# Patient Record
Sex: Male | Born: 1970
Health system: Southern US, Community
[De-identification: ages and names within clinical notes are randomized; demographics above are authoritative.]

## PROBLEM LIST (undated history)

## (undated) DIAGNOSIS — T7840XA Allergy, unspecified, initial encounter: Secondary | ICD-10-CM

## (undated) DIAGNOSIS — K219 Gastro-esophageal reflux disease without esophagitis: Secondary | ICD-10-CM

## (undated) DIAGNOSIS — I10 Essential (primary) hypertension: Secondary | ICD-10-CM

## (undated) DIAGNOSIS — M5412 Radiculopathy, cervical region: Secondary | ICD-10-CM

## (undated) HISTORY — DX: Allergy, unspecified, initial encounter: T78.40XA

## (undated) HISTORY — DX: Radiculopathy, cervical region: M54.12

## (undated) HISTORY — DX: Gastro-esophageal reflux disease without esophagitis: K21.9

## (undated) HISTORY — DX: Essential (primary) hypertension: I10

---

## 2004-06-15 ENCOUNTER — Ambulatory Visit: Payer: Self-pay | Admitting: Family Medicine

## 2005-04-17 ENCOUNTER — Ambulatory Visit: Payer: Self-pay | Admitting: Family Medicine

## 2005-06-28 ENCOUNTER — Ambulatory Visit: Payer: Self-pay | Admitting: Family Medicine

## 2005-07-28 ENCOUNTER — Encounter (INDEPENDENT_AMBULATORY_CARE_PROVIDER_SITE_OTHER): Payer: Self-pay | Admitting: Specialist

## 2005-07-28 ENCOUNTER — Ambulatory Visit: Payer: Self-pay | Admitting: Family Medicine

## 2005-07-28 HISTORY — PX: VASECTOMY: SHX75

## 2005-08-01 ENCOUNTER — Ambulatory Visit: Payer: Self-pay | Admitting: Family Medicine

## 2008-05-26 ENCOUNTER — Ambulatory Visit: Payer: Self-pay | Admitting: Family Medicine

## 2008-08-04 ENCOUNTER — Ambulatory Visit: Payer: Self-pay | Admitting: Family Medicine

## 2008-08-04 DIAGNOSIS — B360 Pityriasis versicolor: Secondary | ICD-10-CM | POA: Insufficient documentation

## 2008-09-30 ENCOUNTER — Telehealth: Payer: Self-pay | Admitting: Family Medicine

## 2008-12-24 ENCOUNTER — Ambulatory Visit: Payer: Self-pay | Admitting: Family Medicine

## 2008-12-24 LAB — CONVERTED CEMR LAB
ALT: 25 units/L (ref 0–53)
Bilirubin, Direct: 0.2 mg/dL (ref 0.0–0.3)
Chloride: 106 meq/L (ref 96–112)
Cholesterol: 177 mg/dL (ref 0–200)
Creatinine, Ser: 1 mg/dL (ref 0.4–1.5)
GFR calc non Af Amer: 88.25 mL/min (ref >60)
HDL: 59.2 mg/dL (ref >39.00)
LDL Cholesterol: 102 mg/dL — ABNORMAL HIGH (ref 0–99)
Potassium: 4 meq/L (ref 3.5–5.1)
Total Bilirubin: 1.1 mg/dL (ref 0.3–1.2)
VLDL: 15.6 mg/dL (ref 0.0–40.0)

## 2010-01-05 ENCOUNTER — Ambulatory Visit: Payer: Self-pay | Admitting: Family Medicine

## 2010-01-05 LAB — CONVERTED CEMR LAB
ALT: 30 units/L (ref 0–53)
Albumin: 4.3 g/dL (ref 3.5–5.2)
Basophils Relative: 0.4 % (ref 0.0–3.0)
CO2: 31 meq/L (ref 19–32)
Chloride: 107 meq/L (ref 96–112)
Creatinine, Ser: 1 mg/dL (ref 0.4–1.5)
Eosinophils Absolute: 0.1 10*3/uL (ref 0.0–0.7)
Eosinophils Relative: 2.1 % (ref 0.0–5.0)
HCT: 41.1 % (ref 39.0–52.0)
Hemoglobin: 14.1 g/dL (ref 13.0–17.0)
Lymphs Abs: 1.8 10*3/uL (ref 0.7–4.0)
MCHC: 34.4 g/dL (ref 30.0–36.0)
MCV: 86.9 fL (ref 78.0–100.0)
Monocytes Absolute: 0.8 10*3/uL (ref 0.1–1.0)
Neutro Abs: 2.8 10*3/uL (ref 1.4–7.7)
Potassium: 4.5 meq/L (ref 3.5–5.1)
RBC: 4.73 M/uL (ref 4.22–5.81)
Sodium: 142 meq/L (ref 135–145)
Total CHOL/HDL Ratio: 3
Total Protein: 7.1 g/dL (ref 6.0–8.3)
Triglycerides: 92 mg/dL (ref 0.0–149.0)
WBC: 5.5 10*3/uL (ref 4.5–10.5)

## 2010-01-06 ENCOUNTER — Ambulatory Visit: Payer: Self-pay | Admitting: Family Medicine

## 2010-08-30 NOTE — Assessment & Plan Note (Signed)
Summary: CPX/FILL OUT FORM FOR [employer]/CE   Vital Signs:  Patient profile:   40 year old male Height:      73 inches Weight:      224.75 pounds BMI:     29.76 Temp:     98.2 degrees F oral Pulse rate:   80 / minute Pulse rhythm:   regular BP sitting:   120 / 82  (left arm) Cuff size:   large  Vitals Entered By: Sydell Axon LPN ([birth year] 06, 1914 8:06 AM) CC: 30 Minute checkup, fill out form for [employer]   History of Present Illness: Pt here for Comp Exam for [employer]. He feels well and has no complaints. He has had itchy nose and eyes.   Preventive Screening-Counseling & Management  Alcohol-Tobacco     Alcohol drinks/day: <1     Alcohol type: beer     Smoking Status: never  Caffeine-Diet-Exercise     Caffeine use/day: 2     Does Patient Exercise: yes     Type of exercise: dog and chldren     Exercise (avg: min/session): daily  Problems Prior to Update: 1)  Health Maintenance Exam  (ICD-V70.0) 2)  Tinea Versicolor  (ICD-111.0)  Medications Prior to Update: 1)  None  Allergies: No Known Drug Allergies  Past History:  Family History: Last updated: 01/06/2010 Father A 25 Mother A 66  DM Siblings: none  Social History: Last updated: 05/26/2008 Marital Status: Married Children: 2 Occupation: IT at [employer]  Risk Factors: Alcohol Use: <1 (01/06/2010) Caffeine Use: 2 (01/06/2010) Exercise: yes (01/06/2010)  Risk Factors: Smoking Status: never (01/06/2010)  Past Surgical History: Vasectomy  [place]) (07/28/2005)  Family History: Father A 45 Mother A 66  DM Siblings: none  Social History: Caffeine use/day:  2 Does Patient Exercise:  yes  Review of Systems General:  Denies chills, fatigue, fever, sweats, weakness, and weight loss. Eyes:  Denies blurring, discharge, eye pain, and itching. ENT:  Denies decreased hearing, earache, and ringing in ears. CV:  Denies chest pain or discomfort, fainting, fatigue, palpitations, shortness  of breath with exertion, swelling of feet, and swelling of hands. Resp:  Denies chest pain with inspiration, cough, shortness of breath, and wheezing. GI:  Denies abdominal pain, bloody stools, change in bowel habits, constipation, dark tarry stools, diarrhea, indigestion, loss of appetite, nausea, vomiting, vomiting blood, and yellowish skin color. GU:  Denies discharge, dysuria, nocturia, and urinary frequency. MS:  Denies joint pain, low back pain, muscle aches, cramps, and stiffness. Derm:  Denies dryness, itching, and rash. Neuro:  Denies numbness, poor balance, tingling, and tremors.  Physical Exam  General:  Well-developed,well-nourished,in no acute distress; alert,appropriate and cooperative throughout examination Head:  Normocephalic and atraumatic without obvious abnormalities. No apparent alopecia or balding. Eyes:  Conjunctiva clear bilaterally.  Ears:  External ear exam shows no significant lesions or deformities.  Otoscopic examination reveals clear canals, tympanic membranes are intact bilaterally without bulging, retraction, inflammation or discharge. Hearing is grossly normal bilaterally. Nose:  External nasal examination shows no deformity or inflammation. Nasal mucosa are pink and moist without lesions or exudates. Mouth:  Oral mucosa and oropharynx without lesions or exudates.  Teeth in good repair. Neck:  No deformities, masses, or tenderness noted. Chest Wall:  No deformities, masses, tenderness or gynecomastia noted. Breasts:  No masses or gynecomastia noted Lungs:  Normal respiratory effort, chest expands symmetrically. Lungs are clear to auscultation, no crackles or wheezes. Heart:  Normal rate and  regular rhythm. S1 and S2 normal without gallop, murmur, click, rub or other extra sounds. Abdomen:  Bowel sounds positive,abdomen soft and non-tender without masses, organomegaly or hernias noted. Rectal:  No external abnormalities noted. Normal sphincter tone. No rectal  masses or tenderness. G neg. Genitalia:  Testes bilaterally descended without nodularity, tenderness or masses. No scrotal masses or lesions. No penis lesions or urethral discharge. Prostate:  Prostate gland firm and smooth, no enlargement, nodularity, tenderness, mass, asymmetry or induration. 10 gms. Msk:  No deformity or scoliosis noted of thoracic or lumbar spine.   Pulses:  R and L carotid,radial,femoral,dorsalis pedis and posterior tibial pulses are full and equal bilaterally Extremities:  No clubbing, cyanosis, edema, or deformity noted with normal full range of motion of all joints.   Neurologic:  No cranial nerve deficits noted. Station and gait are normal. Plantar reflexes are down-going bilaterally. DTRs are symmetrical throughout. Sensory, motor and coordinative functions appear intact. Skin:  Upper chest to nipple area and into axillae sparsely bilat maculopapular somewhat coalescent but individual oval erythem compared to regular skin patches  last visit.Marland KitchenMarland KitchenMarland KitchenAll  resolved today. Skin looks nml Cervical Nodes:  No lymphadenopathy noted Inguinal Nodes:  No significant adenopathy Psych:  Cognition and judgment appear intact. Alert and cooperative with normal attention span and concentration. No apparent delusions, illusions, hallucinations   Impression & Recommendations:  Problem # 1:  HEALTH MAINTENANCE EXAM (ICD-V70.0) Assessment Comment Only  Reviewed preventive care protocols, scheduled due services, and updated immunizations.  Problem # 2:  TINEA VERSICOLOR (ICD-111.0) Assessment: Improved  Has resolved.  Take medication as directed for full duration.   Patient Instructions: 1)  Form for [employer] filled out.  Current Allergies (reviewed today): No known allergies

## 2010-08-30 NOTE — Letter (Signed)
Summary: Physical Examination Form  Physical Examination Form   Imported By: Beau Fanny 01/07/2010 08:21:11  _____________________________________________________________________  External Attachment:    Type:   Image     Comment:   External Document

## 2012-04-08 ENCOUNTER — Encounter: Payer: Self-pay | Admitting: Family Medicine

## 2012-04-08 ENCOUNTER — Ambulatory Visit (INDEPENDENT_AMBULATORY_CARE_PROVIDER_SITE_OTHER): Payer: BC Managed Care – PPO | Admitting: Family Medicine

## 2012-04-08 VITALS — BP 132/92 | HR 64 | Temp 98.5°F | Wt 232.2 lb

## 2012-04-08 DIAGNOSIS — M25522 Pain in left elbow: Secondary | ICD-10-CM | POA: Insufficient documentation

## 2012-04-08 DIAGNOSIS — M79602 Pain in left arm: Secondary | ICD-10-CM

## 2012-04-08 DIAGNOSIS — M79609 Pain in unspecified limb: Secondary | ICD-10-CM

## 2012-04-08 MED ORDER — NAPROXEN 500 MG PO TABS: ORAL_TABLET | ORAL | Status: AC

## 2012-04-08 NOTE — Assessment & Plan Note (Signed)
anticipate lateral epicondylitis vs forearm extensor tendonitis.  Treat as such with NSAIDs, and stretching exercises from [facility] pt advisor. Discussed elbow strap as well. Update [place] if sxs continued despite treatment.

## 2012-04-08 NOTE — Progress Notes (Signed)
  Subjective:    Patient ID: Roger Evans, male    DOB: 1971/02/26, 41 y.o.   MRN: [id number]  HPI CC: L arm pain  Pleasant 41 yo prior pt of Dr. Lorenza Chick presents with 3.5 wk h/o L arm pain starting at elbow, extending down to wrist.  Pain described as dull ache.  Intermittent.  Sometimes notes trouble lifting iPad and laptop with left hand 2/2 pain.  Noted pain posterior elbow, occasionally down to wrist.  Occasional tingling noted L dorsal hand.  No inciting trauma/injury. 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  Went camping in June, had a few tick bites then.  No fevers/chills, neck pain, shooting pain down arms, denies numbness/weakness of arm.  Took advil last night.  Review of Systems Per HPI    Objective:   Physical Exam  Nursing note and vitals reviewed. Constitutional: He appears well-developed and well-nourished. No distress.  Musculoskeletal:       Right elbow: Normal.      Left elbow: He exhibits normal range of motion and no swelling. tenderness found. Lateral epicondyle tenderness noted. No medial epicondyle and no olecranon process tenderness noted.       No erythema, warmth or swelling present. Tender with strength testing of forced supination against resistance and forced extension against resistance at left elbow.       Assessment & Plan:

## 2012-04-08 NOTE — Patient Instructions (Signed)
You have tennis elbow (lateral epicondylitis) or tendonitis of your forearm extensor muscles. take naprosyn twice daily with food for 5 days then as needed for pain/inflammation. Do stretching exercises provided. May buy over the counter elbow strap or band. Let me know if not improving as expected.

## 2012-07-31 DIAGNOSIS — M5412 Radiculopathy, cervical region: Secondary | ICD-10-CM

## 2012-07-31 HISTORY — DX: Radiculopathy, cervical region: M54.12

## 2013-05-29 ENCOUNTER — Ambulatory Visit (INDEPENDENT_AMBULATORY_CARE_PROVIDER_SITE_OTHER): Payer: BC Managed Care – PPO | Admitting: Family Medicine

## 2013-05-29 ENCOUNTER — Encounter: Payer: Self-pay | Admitting: Family Medicine

## 2013-05-29 VITALS — BP 124/76 | HR 92 | Temp 97.8°F | Wt 235.8 lb

## 2013-05-29 DIAGNOSIS — M79609 Pain in unspecified limb: Secondary | ICD-10-CM

## 2013-05-29 DIAGNOSIS — M25519 Pain in unspecified shoulder: Secondary | ICD-10-CM

## 2013-05-29 DIAGNOSIS — M25512 Pain in left shoulder: Secondary | ICD-10-CM

## 2013-05-29 DIAGNOSIS — M79602 Pain in left arm: Secondary | ICD-10-CM

## 2013-05-29 MED ORDER — PREDNISONE 20 MG PO TABS: ORAL_TABLET | ORAL | Status: DC

## 2013-05-29 MED ORDER — METHOCARBAMOL 500 MG PO TABS: 500.0000 mg | ORAL_TABLET | Freq: Four times a day (QID) | ORAL | Status: DC | PRN

## 2013-05-29 NOTE — Assessment & Plan Note (Signed)
I think this is musculoskeletal/myofascial dysfunction - either supraspinatus tendonitis given location of pain or scapular dyskinesis. Will treat with steroid course, and muscle relaxants.   Will refer to physical therapy as well. If persistent, consider further evaluation by ortho.  Check EKG today - sinus arrhythmia in 80s with normal axis, intervals, no hypertrophy or acute ST/T changes.  ?P mitrale

## 2013-05-29 NOTE — Addendum Note (Signed)
Addended by: Eustaquio Boyden on: 05/29/2013 07:27 PM   Modules accepted: Level of Service

## 2013-05-29 NOTE — Patient Instructions (Signed)
I think you have inflammation of muscles of neck on left and possible supraspinatus tendon inflammation. Treat with steroid course and muscle relaxants (both sent to pharmacy). Update [place] if symptoms persist or worsen for further evaluation. Continue ice/heat (whichever soothes better).

## 2013-05-29 NOTE — Progress Notes (Signed)
  Subjective:    Patient ID: Roger Evans, male    DOB: 04-29-1971, 42 y.o.   MRN: [id number]  HPI CC: L shoulder/neck pain  2 wks ago when awoke started noticing L shoulder/neck pain.  Heating pad did help.  Over last 2 days noticing worsening pain.  Describes chest wall pain anteriorly, to shoulder blade and down arm.  Having some tingling of left arm/hand as well.  Hand felt tight.  Worse with movement, positional (bending forward worsens pain).  Constant ache. Neck movement worsens pain. Denies pressure/tightness in chest. No inciting trauma/falls, injury.  No fevers/chills.  No midline neck pain.  So far tried ibuprofen and naprosyn (which helps).  Took total of 6 OTC naprosyn today.  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  No worsening of pain with walking and hiking.  Tries to run on treadmill, has not done this in last 2 weeks.  Stays active at work on [employer], no pain with this.  Does feel some discomfort when walking up stairs.  No fam hx CAD.  No recnet blood work.  Nonsmoker. History reviewed. No pertinent past medical history.  Family History  Problem Relation Age of Onset  . Diabetes Mother     Review of Systems Pre HPI    Objective:   Physical Exam  Nursing note and vitals reviewed. Constitutional: He appears well-developed and well-nourished. No distress.  Cardiovascular: Normal rate, regular rhythm, normal heart sounds and intact distal pulses.   No murmur heard. Pulmonary/Chest: Effort normal and breath sounds normal. No respiratory distress. He has no wheezes. He has no rales.  Musculoskeletal: He exhibits no edema.  FROM of neck, reproducible pain with lateral rotation of neck to right No deformity noted on shoulder exam Tender to palpation L back superior to spine of scapula. FROM at shoulders No pain with int/ext shoulder rotation against resistance. Tender with strength testing of L supraspinatus with empty can test. No  impingement, neg crossover test No pain with rotation of humeral head in GH joint. No chest wall pain to palpation.   Neurological:  Strength intact Sensation intact Neg spurling       Assessment & Plan:

## 2013-06-03 ENCOUNTER — Telehealth: Payer: Self-pay

## 2013-06-03 DIAGNOSIS — M25512 Pain in left shoulder: Secondary | ICD-10-CM

## 2013-06-03 MED ORDER — NAPROXEN 500 MG PO TABS: ORAL_TABLET | ORAL | Status: DC

## 2013-06-03 NOTE — Telephone Encounter (Signed)
Pt was seen 05/29/13 and lt arm pain coming and going but worse at night so pt is not sleeping. Pt said pain has localized as dull achy pain in l t shoulder and sharp pain in lt elbow with numbness in forearm at elbow. Neck pain is same as when seen and no CP.Please advise. [employer].

## 2013-06-03 NOTE — Telephone Encounter (Signed)
Spoke with patient - worsening shoulder and elbow pain despite prednisone. Recommended stop prednisone, will refer to PT, start naprosyn anti inflammatory, and double up on muscle relaxant. Call me tomorrow with update on how night went. Could consider narcotic prn breakthrough pain vs referral to SM.

## 2013-06-03 NOTE — Telephone Encounter (Signed)
Pt left v/m requesting cb. 

## 2013-06-04 NOTE — Telephone Encounter (Signed)
The patient came in and is hoping to pick up an rx for Vicodin to help with shoulder pain. His callback number is [phone]

## 2013-06-04 NOTE — Telephone Encounter (Signed)
Spoke with patient - he actually would rather not add on narcotic. Will continue naprosyn and robaxin, has started exercises which seemed to have helped today. Will continue course, advised if pain worsening, I recommend re evaluation in office. Pt agrees.

## 2013-06-05 ENCOUNTER — Telehealth: Payer: Self-pay

## 2013-06-05 NOTE — Telephone Encounter (Signed)
I would try cutting back on the methocarbamol and see if that helps.  It could be causing some of that.  Thanks.

## 2013-06-05 NOTE — Telephone Encounter (Signed)
Pt only taking Naprosyn and Methocarbamol; pt said he feels moody or sad, no SI or HI. Pt has loss of appetite and sometimes does not feel he can get a deep breath. Pt said he is not having SOB or difficulty breathing that just occasional cannot get a good deep breath.pt wants to know if thinks one of the meds could cause these symptoms.Please advise. [employer].

## 2013-06-05 NOTE — Telephone Encounter (Signed)
Pt.notified

## 2013-06-16 ENCOUNTER — Encounter: Payer: Self-pay | Admitting: Internal Medicine

## 2013-06-30 ENCOUNTER — Encounter: Payer: Self-pay | Admitting: Internal Medicine

## 2013-07-01 ENCOUNTER — Encounter (INDEPENDENT_AMBULATORY_CARE_PROVIDER_SITE_OTHER): Payer: BC Managed Care – PPO | Admitting: Ophthalmology

## 2013-07-01 DIAGNOSIS — H43819 Vitreous degeneration, unspecified eye: Secondary | ICD-10-CM

## 2013-07-01 DIAGNOSIS — H3540 Unspecified peripheral retinal degeneration: Secondary | ICD-10-CM

## 2013-07-31 ENCOUNTER — Encounter: Payer: Self-pay | Admitting: Internal Medicine

## 2015-08-26 ENCOUNTER — Encounter: Payer: Self-pay | Admitting: Family Medicine

## 2015-08-26 ENCOUNTER — Ambulatory Visit (INDEPENDENT_AMBULATORY_CARE_PROVIDER_SITE_OTHER): Payer: BLUE CROSS/BLUE SHIELD | Admitting: Family Medicine

## 2015-08-26 VITALS — BP 132/80 | HR 88 | Temp 98.4°F | Wt 238.2 lb

## 2015-08-26 DIAGNOSIS — H8110 Benign paroxysmal vertigo, unspecified ear: Secondary | ICD-10-CM | POA: Insufficient documentation

## 2015-08-26 DIAGNOSIS — H8111 Benign paroxysmal vertigo, right ear: Secondary | ICD-10-CM

## 2015-08-26 NOTE — Assessment & Plan Note (Signed)
Story/exam consistent with BPPV - treated in office with modified epley canalith repositioning maneuver. Update if persistent for vestibular rehab Discussed meclizine, didn't recommend given mild nature of his symptoms.

## 2015-08-26 NOTE — Patient Instructions (Addendum)
I do think you have benign positional vertigo Do exercises provided today. If persistent trouble let me know for vestibular rehab  Benign Positional Vertigo Vertigo is the feeling that you or your surroundings are moving when they are not. Benign positional vertigo is the most common form of vertigo. The cause of this condition is not serious (is benign). This condition is triggered by certain movements and positions (is positional). This condition can be dangerous if it occurs while you are doing something that could endanger you or others, such as driving.  CAUSES In many cases, the cause of this condition is not known. It may be caused by a disturbance in an area of the inner ear that helps your brain to sense movement and balance. This disturbance can be caused by a viral infection (labyrinthitis), head injury, or repetitive motion. RISK FACTORS This condition is more likely to develop in:  Women.  People who are 41 years of age or older. SYMPTOMS Symptoms of this condition usually happen when you move your head or your eyes in different directions. Symptoms may start suddenly, and they usually last for less than a minute. Symptoms may include:  Loss of balance and falling.  Feeling like you are spinning or moving.  Feeling like your surroundings are spinning or moving.  Nausea and vomiting.  Blurred vision.  Dizziness.  Involuntary eye movement (nystagmus). Symptoms can be mild and cause only slight annoyance, or they can be severe and interfere with daily life. Episodes of benign positional vertigo may return (recur) over time, and they may be triggered by certain movements. Symptoms may improve over time. DIAGNOSIS This condition is usually diagnosed by medical history and a physical exam of the head, neck, and ears. You may be referred to a health care provider who specializes in ear, nose, and throat (ENT) problems (otolaryngologist) or a provider who specializes in disorders  of the nervous system (neurologist). You may have additional testing, including:  MRI.  A CT scan.  Eye movement tests. Your health care provider may ask you to change positions quickly while he or she watches you for symptoms of benign positional vertigo, such as nystagmus. Eye movement may be tested with an electronystagmogram (ENG), caloric stimulation, the Dix-Hallpike test, or the roll test.  An electroencephalogram (EEG). This records electrical activity in your brain.  Hearing tests. TREATMENT Usually, your health care provider will treat this by moving your head in specific positions to adjust your inner ear back to normal. Surgery may be needed in severe cases, but this is rare. In some cases, benign positional vertigo may resolve on its own in 2-4 weeks. HOME CARE INSTRUCTIONS Safety  Move slowly.Avoid sudden body or head movements.  Avoid driving.  Avoid operating heavy machinery.  Avoid doing any tasks that would be dangerous to you or others if a vertigo episode would occur.  If you have trouble walking or keeping your balance, try using a cane for stability. If you feel dizzy or unstable, sit down right away.  Return to your normal activities as told by your health care provider. Ask your health care provider what activities are safe for you. General Instructions  Take over-the-counter and prescription medicines only as told by your health care provider.  Avoid certain positions or movements as told by your health care provider.  Drink enough fluid to keep your urine clear or pale yellow.  Keep all follow-up visits as told by your health care provider. This is important. [place]  CARE IF:  You have a fever.  Your condition gets worse or you develop new symptoms.  Your family or friends notice any behavioral changes.  Your nausea or vomiting gets worse.  You have numbness or a "pins and needles" sensation. SEEK IMMEDIATE MEDICAL CARE IF:  You have  difficulty speaking or moving.  You are always dizzy.  You faint.  You develop severe headaches.  You have weakness in your legs or arms.  You have changes in your hearing or vision.  You develop a stiff neck.  You develop sensitivity to light.   This information is not intended to replace advice given to you by your health care provider. Make sure you discuss any questions you have with your health care provider.   Document Released: 04/24/2006 Document Revised: 04/07/2015 Document Reviewed: 11/09/2014 Elsevier Interactive Patient Education [employer].

## 2015-08-26 NOTE — Progress Notes (Signed)
Pre visit review using our clinic review tool, if applicable. No additional management support is needed unless otherwise documented below in the visit note. 

## 2015-08-26 NOTE — Progress Notes (Signed)
   BP 132/80 mmHg  Pulse 88  Temp(Src) 98.4 F (36.9 C) (Oral)  Wt 238 lb 4 oz (108.069 kg)   CC: dizziness  Subjective:    Patient ID: Roger Evans, male    DOB: 05/19/1971, 45 y.o.   MRN: [id number]  HPI: POE Roger Evans is a 45 y.o. male presenting on 08/26/2015 for Dizziness   Last seen here 04/2013.   Woke up Monday morning with dizziness, reoccurred when lay in bed again. Only happening with position changes to supine. Dizziness described as drunk sensation that lasts <1 minute "room spinning".   No hearing changes, no tinnitus. No presyncope. No nausea. No headaches, vision changes.  No recent cold symptoms, congestion, cough. No fever.  PRN ibuprofen.   Known cervical HNP C5-7. Sees [facility] [facility].   Relevant past medical, surgical, family and social history reviewed and updated as indicated. Interim medical history since our last visit reviewed. Allergies and medications reviewed and updated. No current outpatient prescriptions on file prior to visit.   No current facility-administered medications on file prior to visit.    Review of Systems Per HPI unless specifically indicated in ROS section     Objective:    BP 132/80 mmHg  Pulse 88  Temp(Src) 98.4 F (36.9 C) (Oral)  Wt 238 lb 4 oz (108.069 kg)  Wt Readings from Last 3 Encounters:  08/26/15 238 lb 4 oz (108.069 kg)  05/29/13 235 lb 12 oz (106.935 kg)  04/08/12 232 lb 4 oz (105.348 kg)    Physical Exam  Constitutional: He is oriented to person, place, and time. He appears well-developed and well-nourished. No distress.  HENT:  Mouth/Throat: Oropharynx is clear and moist. No oropharyngeal exudate.  Eyes: Conjunctivae and EOM are normal. Pupils are equal, round, and reactive to light. No scleral icterus.  Neck: Normal range of motion. Neck supple. Carotid bruit is not present. No thyromegaly present.  Cardiovascular: Normal rate, regular rhythm, normal heart sounds and intact distal pulses.   No murmur  heard. Pulmonary/Chest: Effort normal and breath sounds normal. No respiratory distress. He has no wheezes. He has no rales.  Musculoskeletal: He exhibits no edema.  Lymphadenopathy:    He has no cervical adenopathy.  Neurological: He is alert and oriented to person, place, and time. No cranial nerve deficit. He displays a negative Romberg sign. Coordination and gait normal.  CN 2-12 intact EOMI  FTN intact dix hallpike ++ on right S/p epley maneuver in office  Skin: Skin is warm and dry. No rash noted.  Psychiatric: He has a normal mood and affect.  Nursing note and vitals reviewed.      Assessment & Plan:   Problem List Items Addressed This Visit    BPV (benign positional vertigo) - Primary    Story/exam consistent with BPPV - treated in office with modified epley canalith repositioning maneuver. Update if persistent for vestibular rehab Discussed meclizine, didn't recommend given mild nature of his symptoms.          Follow up plan: No Follow-up on file.

## 2015-11-01 ENCOUNTER — Telehealth: Payer: Self-pay | Admitting: Family Medicine

## 2015-11-01 DIAGNOSIS — Z13220 Encounter for screening for lipoid disorders: Secondary | ICD-10-CM

## 2015-11-01 DIAGNOSIS — Z131 Encounter for screening for diabetes mellitus: Secondary | ICD-10-CM

## 2015-11-01 NOTE — Telephone Encounter (Signed)
Pt wanted to get his cpx labs done at [facility] [facility] phone number [phone] Please fax order so he can get this done Please advise when order has been faxed

## 2015-11-03 NOTE — Telephone Encounter (Signed)
Patient called back asking about order.  Patient said the fax number is [phone].

## 2015-11-04 NOTE — Telephone Encounter (Signed)
Order written and placed in Kim's box. plz notify patient

## 2015-11-05 NOTE — Telephone Encounter (Signed)
Order faxed and patient notified.  

## 2015-11-10 ENCOUNTER — Other Ambulatory Visit: Payer: Self-pay

## 2015-11-15 ENCOUNTER — Ambulatory Visit (INDEPENDENT_AMBULATORY_CARE_PROVIDER_SITE_OTHER): Payer: BLUE CROSS/BLUE SHIELD | Admitting: Family Medicine

## 2015-11-15 ENCOUNTER — Encounter: Payer: Self-pay | Admitting: Family Medicine

## 2015-11-15 VITALS — BP 118/86 | HR 80 | Temp 98.0°F | Ht 71.5 in | Wt 233.0 lb

## 2015-11-15 DIAGNOSIS — Z0000 Encounter for general adult medical examination without abnormal findings: Secondary | ICD-10-CM | POA: Diagnosis not present

## 2015-11-15 DIAGNOSIS — E669 Obesity, unspecified: Secondary | ICD-10-CM | POA: Diagnosis not present

## 2015-11-15 DIAGNOSIS — R350 Frequency of micturition: Secondary | ICD-10-CM | POA: Insufficient documentation

## 2015-11-15 DIAGNOSIS — Z0001 Encounter for general adult medical examination with abnormal findings: Secondary | ICD-10-CM | POA: Insufficient documentation

## 2015-11-15 LAB — POC URINALSYSI DIPSTICK (AUTOMATED)
Bilirubin, UA: NEGATIVE
Blood, UA: NEGATIVE
GLUCOSE UA: NEGATIVE
Ketones, UA: NEGATIVE
LEUKOCYTES UA: NEGATIVE
NITRITE UA: NEGATIVE
PROTEIN UA: NEGATIVE
SPEC GRAV UA: 1.025
UROBILINOGEN UA: 0.2
pH, UA: 6

## 2015-11-15 NOTE — Progress Notes (Signed)
Pre visit review using our clinic review tool, if applicable. No additional management support is needed unless otherwise documented below in the visit note. 

## 2015-11-15 NOTE — Assessment & Plan Note (Signed)
Preventative protocols reviewed and updated unless pt declined. Discussed healthy diet and lifestyle.  

## 2015-11-15 NOTE — Assessment & Plan Note (Signed)
Discussed healthy diet and lifestyle changes to affect sustainable weight loss  

## 2015-11-15 NOTE — Addendum Note (Signed)
Addended by: Pilar Grammes on: 11/15/2015 04:11 PM   Modules accepted: Orders

## 2015-11-15 NOTE — Assessment & Plan Note (Addendum)
?  prostatitis vs UTI - check UA today.  Check PSA today as well. DRE reassuring today. Reviewed bladder irritants and encouraged avoidance.

## 2015-11-15 NOTE — Patient Instructions (Addendum)
Sign release of information for latest office note from [facility]. You are doing well today. Continue healthy diet and work on regular exercise regimen. Return as needed or in 1-2 years for next physical. Urinalysis and PSA checked today - we will call you with results.   Health Maintenance, Male A healthy lifestyle and preventative care can promote health and wellness.  Maintain regular health, dental, and eye exams.  Eat a healthy diet. Foods like vegetables, fruits, whole grains, low-fat dairy products, and lean protein foods contain the nutrients you need and are low in calories. Decrease your intake of foods high in solid fats, added sugars, and salt. Get information about a proper diet from your health care provider, if necessary.  Regular physical exercise is one of the most important things you can do for your health. Most adults should get at least 150 minutes of moderate-intensity exercise (any activity that increases your heart rate and causes you to sweat) each week. In addition, most adults need muscle-strengthening exercises on 2 or more days a week.   Maintain a healthy weight. The body mass index (BMI) is a screening tool to identify possible weight problems. It provides an estimate of body fat based on height and weight. Your health care provider can find your BMI and can help you achieve or maintain a healthy weight. For males 20 years and older:  A BMI below 18.5 is considered underweight.  A BMI of 18.5 to 24.9 is normal.  A BMI of 25 to 29.9 is considered overweight.  A BMI of 30 and above is considered obese.  Maintain normal blood lipids and cholesterol by exercising and minimizing your intake of saturated fat. Eat a balanced diet with plenty of fruits and vegetables. Blood tests for lipids and cholesterol should begin at age 67 and be repeated every 5 years. If your lipid or cholesterol levels are high, you are over age 47, or you are at high risk for  heart disease, you may need your cholesterol levels checked more frequently.Ongoing high lipid and cholesterol levels should be treated with medicines if diet and exercise are not working.  If you smoke, find out from your health care provider how to quit. If you do not use tobacco, do not start.  Lung cancer screening is recommended for adults aged 66-80 years who are at high risk for developing lung cancer because of a history of smoking. A yearly low-dose CT scan of the lungs is recommended for people who have at least a 30-pack-year history of smoking and are current smokers or have quit within the past 15 years. A pack year of smoking is smoking an average of 1 pack of cigarettes a day for 1 year (for example, a 30-pack-year history of smoking could mean smoking 1 pack a day for 30 years or 2 packs a day for 15 years). Yearly screening should continue until the smoker has stopped smoking for at least 15 years. Yearly screening should be stopped for people who develop a health problem that would prevent them from having lung cancer treatment.  If you choose to drink alcohol, do not have more than 2 drinks per day. One drink is considered to be 12 oz (360 mL) of beer, 5 oz (150 mL) of wine, or 1.5 oz (45 mL) of liquor.  Avoid the use of street drugs. Do not share needles with anyone. Ask for help if you need support or instructions about stopping the use of drugs.  High blood  pressure causes heart disease and increases the risk of stroke. High blood pressure is more likely to develop in:  People who have blood pressure in the end of the normal range (100-139/85-89 mm Hg).  People who are overweight or obese.  People who are African American.  If you are 31-66 years of age, have your blood pressure checked every 3-5 years. If you are 68 years of age or older, have your blood pressure checked every year. You should have your blood pressure measured twice--once when you are at a hospital or  clinic, and once when you are not at a hospital or clinic. Record the average of the two measurements. To check your blood pressure when you are not at a hospital or clinic, you can use:  An automated blood pressure machine at a pharmacy.  A home blood pressure monitor.  If you are 66-65 years old, ask your health care provider if you should take aspirin to prevent heart disease.  Diabetes screening involves taking a blood sample to check your fasting blood sugar level. This should be done once every 3 years after age 34 if you are at a normal weight and without risk factors for diabetes. Testing should be considered at a younger age or be carried out more frequently if you are overweight and have at least 1 risk factor for diabetes.  Colorectal cancer can be detected and often prevented. Most routine colorectal cancer screening begins at the age of 20 and continues through age 68. However, your health care provider may recommend screening at an earlier age if you have risk factors for colon cancer. On a yearly basis, your health care provider may provide home test kits to check for hidden blood in the stool. A small camera at the end of a tube may be used to directly examine the colon (sigmoidoscopy or colonoscopy) to detect the earliest forms of colorectal cancer. Talk to your health care provider about this at age 4 when routine screening begins. A direct exam of the colon should be repeated every 5-10 years through age 71, unless early forms of precancerous polyps or small growths are found.  People who are at an increased risk for hepatitis B should be screened for this virus. You are considered at high risk for hepatitis B if:  You were born in a country where hepatitis B occurs often. Talk with your health care provider about which countries are considered high risk.  Your parents were born in a high-risk country and you have not received a shot to protect against hepatitis B (hepatitis B  vaccine).  You have HIV or AIDS.  You use needles to inject street drugs.  You live with, or have sex with, someone who has hepatitis B.  You are a man who has sex with other men (MSM).  You get hemodialysis treatment.  You take certain medicines for conditions like cancer, organ transplantation, and autoimmune conditions.  Hepatitis C blood testing is recommended for all people born from [birth year] through 1965 and any individual with known risk factors for hepatitis C.  Healthy men should no longer receive prostate-specific antigen (PSA) blood tests as part of routine cancer screening. Talk to your health care provider about prostate cancer screening.  Testicular cancer screening is not recommended for adolescents or adult males who have no symptoms. Screening includes self-exam, a health care provider exam, and other screening tests. Consult with your health care provider about any symptoms you have or any concerns you  have about testicular cancer.  Practice safe sex. Use condoms and avoid high-risk sexual practices to reduce the spread of sexually transmitted infections (STIs).  You should be screened for STIs, including gonorrhea and chlamydia if:  You are sexually active and are younger than 24 years.  You are older than 24 years, and your health care provider tells you that you are at risk for this type of infection.  Your sexual activity has changed since you were last screened, and you are at an increased risk for chlamydia or gonorrhea. Ask your health care provider if you are at risk.  If you are at risk of being infected with HIV, it is recommended that you take a prescription medicine daily to prevent HIV infection. This is called pre-exposure prophylaxis (PrEP). You are considered at risk if:  You are a man who has sex with other men (MSM).  You are a heterosexual man who is sexually active with multiple partners.  You take drugs by injection.  You are sexually active  with a partner who has HIV.  Talk with your health care provider about whether you are at high risk of being infected with HIV. If you choose to begin PrEP, you should first be tested for HIV. You should then be tested every 3 months for as long as you are taking PrEP.  Use sunscreen. Apply sunscreen liberally and repeatedly throughout the day. You should seek shade when your shadow is shorter than you. Protect yourself by wearing long sleeves, pants, a wide-brimmed hat, and sunglasses year round whenever you are outdoors.  Tell your health care provider of new moles or changes in moles, especially if there is a change in shape or color. Also, tell your health care provider if a mole is larger than the size of a pencil eraser.  A one-time screening for abdominal aortic aneurysm (AAA) and surgical repair of large AAAs by ultrasound is recommended for men aged 16-75 years who are current or former smokers.  Stay current with your vaccines (immunizations).   This information is not intended to replace advice given to you by your health care provider. Make sure you discuss any questions you have with your health care provider.   Document Released: 01/13/2008 Document Revised: 08/07/2014 Document Reviewed: 12/12/2010 Elsevier Interactive Patient Education [employer].

## 2015-11-15 NOTE — Progress Notes (Signed)
BP 118/86 mmHg  Pulse 80  Temp(Src) 98 F (36.7 C) (Oral)  Ht 5' 11.5" (1.816 m)  Wt 233 lb (105.688 kg)  BMI 32.05 kg/m2  SpO2 96%   CC: CPE  Subjective:    Patient ID: Roger Evans, male    DOB: [birth year], 45 y.o.   MRN: [id number]  HPI: Roger Evans is a 45 y.o. male presenting on 11/15/2015 for Annual Exam   Cervical HNP C5/C7 - saw ortho, PT. We will request records.  Endorses 4 wk h/o increased frequency with mild urgency. Treated with increased water and cranberry juice. No fevers/chills, flank pain, abd pain, nausea, dysuria, urinary incontinence, hematuria. No hesitancy or weakness of stream. Mild dribbling.   Preventative: Did not receive flu shot Td 2010 Seat belt use discussed Sunscreen use discussed. No changing moles on skin. Sees derm regularly.   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 Interim medical history since our last visit reviewed. Allergies and medications reviewed and updated. Current Outpatient Prescriptions on File Prior to Visit  Medication Sig  . IBUPROFEN PO Take by mouth as needed.   No current facility-administered medications on file prior to visit.    Review of Systems  Constitutional: Negative for fever, chills, activity change, appetite change, fatigue and unexpected weight change.  HENT: Positive for congestion (sinus congestion). Negative for hearing loss.   Eyes: Negative for visual disturbance.  Respiratory: Negative for cough, chest tightness, shortness of breath and wheezing.   Cardiovascular: Negative for chest pain, palpitations and leg swelling.  Gastrointestinal: Negative for nausea, vomiting, abdominal pain, diarrhea, constipation, blood in stool and abdominal distention.  Genitourinary: Negative for hematuria  and difficulty urinating.  Musculoskeletal: Negative for myalgias, arthralgias and neck pain.  Skin: Negative for rash.  Neurological: Negative for dizziness, seizures, syncope and headaches.  Hematological: Negative for adenopathy. Does not bruise/bleed easily.  Psychiatric/Behavioral: Negative for dysphoric mood. The patient is not nervous/anxious.    Per HPI unless specifically indicated in ROS section     Objective:    BP 118/86 mmHg  Pulse 80  Temp(Src) 98 F (36.7 C) (Oral)  Ht 5' 11.5" (1.816 m)  Wt 233 lb (105.688 kg)  BMI 32.05 kg/m2  SpO2 96%  Wt Readings from Last 3 Encounters:  11/15/15 233 lb (105.688 kg)  08/26/15 238 lb 4 oz (108.069 kg)  05/29/13 235 lb 12 oz (106.935 kg)    Physical Exam  Constitutional: He is oriented to person, place, and time. He appears well-developed and well-nourished. No distress.  HENT:  Head: Normocephalic and atraumatic.  Right Ear: Hearing, tympanic membrane, external ear and ear canal normal.  Left Ear: Hearing, tympanic membrane, external ear and ear canal normal.  Nose: Nose normal.  Mouth/Throat: Uvula is midline, oropharynx is clear and moist and mucous membranes are normal. No oropharyngeal exudate, posterior oropharyngeal edema or posterior oropharyngeal erythema.  Eyes: Conjunctivae and EOM are normal. Pupils are equal, round, and reactive to light. No scleral icterus.  Neck: Normal range of motion. Neck supple. No thyromegaly present.  Cardiovascular: Normal rate, regular rhythm, normal heart sounds and intact distal pulses.   No murmur heard. Pulses:      Radial pulses are 2+ on the right side, and 2+ on the left side.  Pulmonary/Chest: Effort normal and breath sounds  normal. No respiratory distress. He has no wheezes. He has no rales.  Abdominal: Soft. Bowel sounds are normal. He exhibits no distension and no mass. There is no tenderness. There is no rebound and no guarding.  Genitourinary: Rectum normal and prostate  normal. Rectal exam shows no external hemorrhoid, no internal hemorrhoid, no fissure, no mass, no tenderness and anal tone normal. Prostate is not enlarged (20gm) and not tender.  Musculoskeletal: Normal range of motion. He exhibits no edema.  Lymphadenopathy:    He has no cervical adenopathy.  Neurological: He is alert and oriented to person, place, and time.  CN grossly intact, station and gait intact  Skin: Skin is warm and dry. No rash noted.  Psychiatric: He has a normal mood and affect. His behavior is normal. Judgment and thought content normal.  Nursing note and vitals reviewed.  Results for orders placed or performed in visit on 01/05/10  [facility]  Result Value Ref Range   Cholesterol 185 0-200 mg/dL   Triglycerides 92.0 0.0-149.0 mg/dL   HDL 53.30 >39.00 mg/dL   VLDL 18.4 0.0-40.0 mg/dL   LDL Cholesterol 113 (H) 0-99 mg/dL   Total CHOL/HDL Ratio 3    Sodium 142 135-145 meq/L   Potassium 4.5 3.5-5.1 meq/L   Chloride 107 96-112 meq/L   CO2 31 19-32 meq/L   Glucose, Bld 94 70-99 mg/dL   BUN 18 6-23 mg/dL   Creatinine, Ser 1.0 0.4-1.5 mg/dL   Calcium 9.6 8.4-10.5 mg/dL   GFR calc non Af Amer 84.84 >60 mL/min   WBC 5.5 4.5-10.5 10*3/microliter   RBC 4.73 4.22-5.81 M/uL   Hemoglobin 14.1 13.0-17.0 g/dL   HCT 41.1 39.0-52.0 %   MCV 86.9 78.0-100.0 fL   MCHC 34.4 30.0-36.0 g/dL   RDW 12.9 11.5-14.6 %   Platelets 256.0 150.0-400.0 K/uL   Neutrophils Relative % 51.0 43.0-77.0 %   Lymphocytes Relative 32.7 12.0-46.0 %   Monocytes Relative 13.8 (H) 3.0-12.0 %   Eosinophils Relative 2.1 0.0-5.0 %   Basophils Relative 0.4 0.0-3.0 %   Neutro Abs 2.8 1.4-7.7 K/uL   Lymphs Abs 1.8 0.7-4.0 K/uL   Monocytes Absolute 0.8 0.1-1.0 K/uL   Eosinophils Absolute 0.1 0.0-0.7 K/uL   Basophils Absolute 0.0 0.0-0.1 K/uL   Total Bilirubin 1.1 0.3-1.2 mg/dL   Bilirubin, Direct 0.2 0.0-0.3 mg/dL   Alkaline Phosphatase 65 39-117 units/L   AST 20 0-37 units/L   ALT 30 0-53 units/L     Total Protein 7.1 6.0-8.3 g/dL   Albumin 4.3 3.5-5.2 g/dL   TSH 2.11 0.35-5.50 microintl units/mL      Assessment & Plan:   Problem List Items Addressed This Visit    Health maintenance examination - Primary    Preventative protocols reviewed and updated unless pt declined. Discussed healthy diet and lifestyle.       Obesity, Class I, BMI 30-34.9    Discussed healthy diet and lifestyle changes to affect sustainable weight loss.      Urinary frequency    ?prostatitis vs UTI - check UA today.  Check PSA today as well. DRE reassuring today. Reviewed bladder irritants and encouraged avoidance.       Relevant Orders   PSA       Follow up plan: Return in about 1 year (around 11/14/2016), or as needed.  Ria Bush, MD

## 2015-11-16 LAB — PSA: PSA: 0.52 ng/mL (ref 0.10–4.00)

## 2015-12-10 ENCOUNTER — Encounter: Payer: Self-pay | Admitting: Family Medicine

## 2015-12-10 DIAGNOSIS — M5412 Radiculopathy, cervical region: Secondary | ICD-10-CM | POA: Insufficient documentation

## 2016-11-07 DIAGNOSIS — D2261 Melanocytic nevi of right upper limb, including shoulder: Secondary | ICD-10-CM | POA: Diagnosis not present

## 2016-11-07 DIAGNOSIS — L570 Actinic keratosis: Secondary | ICD-10-CM | POA: Diagnosis not present

## 2017-11-07 DIAGNOSIS — D225 Melanocytic nevi of trunk: Secondary | ICD-10-CM | POA: Diagnosis not present

## 2017-11-07 DIAGNOSIS — D2262 Melanocytic nevi of left upper limb, including shoulder: Secondary | ICD-10-CM | POA: Diagnosis not present

## 2017-11-07 DIAGNOSIS — L821 Other seborrheic keratosis: Secondary | ICD-10-CM | POA: Diagnosis not present

## 2017-11-07 DIAGNOSIS — D2261 Melanocytic nevi of right upper limb, including shoulder: Secondary | ICD-10-CM | POA: Diagnosis not present

## 2018-07-04 ENCOUNTER — Encounter: Payer: Self-pay | Admitting: Family Medicine

## 2018-07-04 ENCOUNTER — Ambulatory Visit: Payer: BLUE CROSS/BLUE SHIELD | Admitting: Family Medicine

## 2018-07-04 VITALS — BP 124/80 | HR 91 | Temp 98.2°F | Ht 71.5 in | Wt 246.5 lb

## 2018-07-04 DIAGNOSIS — Z23 Encounter for immunization: Secondary | ICD-10-CM

## 2018-07-04 DIAGNOSIS — J019 Acute sinusitis, unspecified: Secondary | ICD-10-CM | POA: Diagnosis not present

## 2018-07-04 MED ORDER — FLUTICASONE PROPIONATE 50 MCG/ACT NA SUSP: 2.0000 | Freq: Every day | NASAL | 1 refills | Status: AC

## 2018-07-04 MED ORDER — AMOXICILLIN-POT CLAVULANATE 875-125 MG PO TABS: 1.0000 | ORAL_TABLET | Freq: Two times a day (BID) | ORAL | 0 refills | Status: AC

## 2018-07-04 NOTE — Patient Instructions (Addendum)
Flu shot today You have a sinus infection, likely viral. This should improve over next few days. Take flonase for sinus inflammation. Push fluids and plenty of rest. Nasal saline irrigation or neti pot to help drain sinuses. May use plain mucinex with plenty of fluid to help mobilize mucous. If worsening or ongoing symptoms last next few days, fill antibiotic provided today. Please let us know if fever >101.5, trouble opening/closing mouth, difficulty swallowing, or worsening instead of improving as expected.

## 2018-07-04 NOTE — Progress Notes (Signed)
BP 124/80 (BP Location: Left Arm, Patient Position: Sitting, Cuff Size: Large)   Pulse 91   Temp 98.2 F (36.8 C) (Oral)   Ht 5' 11.5" (1.816 m)   Wt 246 lb 8 oz (111.8 kg)   SpO2 98%   BMI 33.90 kg/m    CC: sinus congestion Subjective:    Patient ID: Roger Evans, male    DOB: Mar 15, 1971, 47 y.o.   MRN: [id number]  HPI: Roger Evans is a 47 y.o. male presenting on 07/04/2018 for Sinus Problem (C/o HA, nasal congestion and drainage. Sxs started about 1 wk ago. Tried ibuprofen. )   1.5 wk h/o HA, sinus congestion and drainage in the mornings, trouble sleeping at night time due to congestion. Mild non productive cough. Mild ST and PNdrainage. Mild exertional dyspnea yesterday walking across campus.   So far taking ibuprofen 400mg  and increased water, hot teas.   No fevers/chills, appetite changes, diarrhea, or wheezing, ear or tooth pain.  No sick contacts at home. No h/o asthma or allergies.  Non smoker.  Relevant past medical, surgical, family and social history reviewed and updated as indicated. Interim medical history since our last visit reviewed. Allergies and medications reviewed and updated. Outpatient Medications Prior to Visit  Medication Sig Dispense Refill  . IBUPROFEN PO Take by mouth as needed.     No facility-administered medications prior to visit.      Per HPI unless specifically indicated in ROS section below Review of Systems     Objective:    BP 124/80 (BP Location: Left Arm, Patient Position: Sitting, Cuff Size: Large)   Pulse 91   Temp 98.2 F (36.8 C) (Oral)   Ht 5' 11.5" (1.816 m)   Wt 246 lb 8 oz (111.8 kg)   SpO2 98%   BMI 33.90 kg/m   Wt Readings from Last 3 Encounters:  07/04/18 246 lb 8 oz (111.8 kg)  11/15/15 233 lb (105.7 kg)  08/26/15 238 lb 4 oz (108.1 kg)    Physical Exam  Constitutional: He appears well-developed and well-nourished. No distress.  HENT:  Head: Normocephalic and atraumatic.  Right Ear: Hearing, tympanic  membrane, external ear and ear canal normal.  Left Ear: Hearing, tympanic membrane, external ear and ear canal normal.  Nose: Mucosal edema (L>>R nasal mucosal congestion, erythema and injection) present. No rhinorrhea. Right sinus exhibits frontal sinus tenderness. Right sinus exhibits no maxillary sinus tenderness. Left sinus exhibits no maxillary sinus tenderness and no frontal sinus tenderness.  Mouth/Throat: Uvula is midline and mucous membranes are normal. Posterior oropharyngeal erythema present. No oropharyngeal exudate, posterior oropharyngeal edema or tonsillar abscesses.  Eyes: Pupils are equal, round, and reactive to light. Conjunctivae and EOM are normal. No scleral icterus.  Neck: Normal range of motion. Neck supple.  Cardiovascular: Normal rate, regular rhythm, normal heart sounds and intact distal pulses.  No murmur heard. Pulmonary/Chest: Effort normal and breath sounds normal. No respiratory distress. He has no wheezes. He has no rales.  Lymphadenopathy:    He has no cervical adenopathy.  Skin: Skin is warm and dry. No rash noted.  Nursing note and vitals reviewed.     Assessment & Plan:   Problem List Items Addressed This Visit    Acute non-recurrent sinusitis - Primary    Anticipate viral given short duration and slow improvement noted last few days. Supportive care reviewed - rec flonase and continued ibuprofen. WASP for augmentin provided with indications when to fill. Red flags to update [place]  also provided.       Relevant Medications   amoxicillin-clavulanate (AUGMENTIN) 875-125 MG tablet   fluticasone (FLONASE) 50 MCG/ACT nasal spray    Other Visit Diagnoses    Need for influenza vaccination       Relevant Orders   Flu Vaccine QUAD 36+ mos IM (Completed)       Meds ordered this encounter  Medications  . amoxicillin-clavulanate (AUGMENTIN) 875-125 MG tablet    Sig: Take 1 tablet by mouth 2 (two) times daily for 10 days.    Dispense:  20 tablet    Refill:  0    . fluticasone (FLONASE) 50 MCG/ACT nasal spray    Sig: Place 2 sprays into both nostrils daily.    Dispense:  16 g    Refill:  1   Orders Placed This Encounter  Procedures  . Flu Vaccine QUAD 36+ mos IM    Follow up plan: Return if symptoms worsen or fail to improve.  Ria Bush, MD

## 2018-07-04 NOTE — Assessment & Plan Note (Signed)
Anticipate viral given short duration and slow improvement noted last few days. Supportive care reviewed - rec flonase and continued ibuprofen. WASP for augmentin provided with indications when to fill. Red flags to update [place] also provided.

## 2018-07-27 DIAGNOSIS — R1032 Left lower quadrant pain: Secondary | ICD-10-CM | POA: Diagnosis not present

## 2018-07-29 ENCOUNTER — Telehealth: Payer: Self-pay

## 2018-07-29 NOTE — Telephone Encounter (Signed)
[facility] faxed note from 07/27/18; pt having lower abd pain. Pt said he was seen at [facility] in [place] on 07/27/18; pt was advised to drink plenty of fluids; pt had constipation; pt had BM and is doing OK now. Pt will cb if needed. FYI to Dr Darnell Level.

## 2018-07-29 NOTE — Telephone Encounter (Signed)
Noted  

## 2019-01-17 ENCOUNTER — Ambulatory Visit (INDEPENDENT_AMBULATORY_CARE_PROVIDER_SITE_OTHER): Payer: BC Managed Care – PPO | Admitting: Family Medicine

## 2019-01-17 ENCOUNTER — Encounter: Payer: Self-pay | Admitting: Family Medicine

## 2019-01-17 ENCOUNTER — Other Ambulatory Visit: Payer: Self-pay

## 2019-01-17 VITALS — BP 126/70 | HR 92 | Temp 97.6°F | Ht 71.5 in | Wt 253.1 lb

## 2019-01-17 DIAGNOSIS — N62 Hypertrophy of breast: Secondary | ICD-10-CM | POA: Diagnosis not present

## 2019-01-17 DIAGNOSIS — Z23 Encounter for immunization: Secondary | ICD-10-CM

## 2019-01-17 LAB — TESTOSTERONE: Testosterone: 126.66 ng/dL — ABNORMAL LOW (ref 300.00–890.00)

## 2019-01-17 LAB — LUTEINIZING HORMONE: LH: 0.68 m[IU]/mL — ABNORMAL LOW (ref 1.50–9.30)

## 2019-01-17 LAB — TSH: TSH: 1.82 u[IU]/mL (ref 0.35–4.50)

## 2019-01-17 NOTE — Assessment & Plan Note (Signed)
Story/exam most consistent with unilateral mild R sided gynecomastia given symmetrical central location of fullness. No inciting medications. Will start with hormonal evaluation, consider imaging if unrevealing. Suggested warm compresses to area. Pt agrees with plan.

## 2019-01-17 NOTE — Patient Instructions (Signed)
Labs today Tdap today. We will be in touch with results.   Gynecomastia, Adult Gynecomastia is an overgrowth of gland tissue in a man's breasts. This may cause one or both breasts to become enlarged. This often develops in men who have an imbalance of the male sex hormone (testosterone) and the male sex hormone (estrogen). This means that a man may have too much estrogen, too little testosterone, or both. Gynecomastia may be a normal part of aging for some men. It can also happen to adolescent boys during puberty. What are the causes? Gynecomastia may be caused by:  Certain medicines, such as: ? Estrogen supplements and medicines that act like estrogen in the body. ? Medicines that keep testosterone from functioning normally in the body (testosterone-inhibiting drugs). ? Anabolic steroids. ? Medicines to treat heartburn, cancer, heart disease, mental health problems, HIV (human immunodeficiency virus) or AIDS (acquired immunodeficiency syndrome). ? Antibiotic medicine. ? Chemotherapy medicine.  Recreational drugs, including alcohol, marijuana, and opioids.  Herbal products, including lavender and tea tree oil.  A gene that is passed along from parent to child (inherited).  Tumors in the pituitary or adrenal gland.  An overactive thyroid gland.  Certain inherited disorders, including a genetic disease that causes low testosterone in males (Klinefelter syndrome).  Cancer of the lung, kidney, liver, testicle, or gastrointestinal tract.  Conditions that cause liver or kidney failure.  Poor nutrition and starvation.  Testicle shrinking or failure (testicularatrophy). In some cases, the cause may not be known. What increases the risk? You may have a higher risk for gynecomastia if you:  Are 49 years old or older.  Are overweight.  Abuse alcohol or other drugs.  Have a family history of gynecomastia. What are the signs or symptoms?  Most of the time, breast enlargement is  the only symptom. The enlargement may start near the nipple, and the breast tissue may feel firm and rubbery. The breast may feel itchy, painful or tender. How is this diagnosed? This condition may be diagnosed based on:  Your symptoms.  Your medical history.  A physical exam.  Imaging tests, such as: ? An ultrasound. ? A mammogram. ? An MRI.  Blood tests.  Removal of a sample of breast tissue to be tested in a lab (biopsy). How is this treated? Gynecomastia may go away on its own, without treatment. If gynecomastia is caused by a medical problem or drug abuse, treatment may include:  Getting treatment for the underlying medical problem or for drug abuse.  Changing or stopping medicines.  Medicines to block the effects of estrogen.  Taking a testosterone replacement.  Surgery to remove breast tissue or any lumps in your breasts.  Breast reduction surgery. This may be a possibility if you have severe or painful gynecomastia. Follow these instructions at home:  Take over-the-counter and prescription medicines only as told by your health care provider.  Talk to your health care provider before taking any herbal medicines or diet supplements.  Do not abuse drugs or alcohol.  Keep all follow-up visits as told by your health care provider. This is important. Contact a health care provider if:  Your breast tissue grows larger or gets more swollen or painful.  You have a lump in your testicle.  You have blood or discharge coming from your nipples.  Your nipple changes shape.  You develop a hard or painful lump in your breast. This information is not intended to replace advice given to you by your health care provider. Make  sure you discuss any questions you have with your health care provider. Document Released: 09/10/2015 Document Revised: 12/24/2015 Document Reviewed: 09/10/2015 Elsevier Interactive Patient Education  2019 [employer].

## 2019-01-17 NOTE — Progress Notes (Signed)
This visit was conducted in person.  BP 126/70 (BP Location: Left Arm, Patient Position: Sitting, Cuff Size: Large)   Pulse 92   Temp 97.6 F (36.4 C) (Tympanic)   Ht 5' 11.5" (1.816 m)   Wt 253 lb 1 oz (114.8 kg)   SpO2 96%   BMI 34.80 kg/m    CC: R chest wall pain Subjective:    Patient ID: Roger Evans, male    DOB: Sep 07, 1970, 48 y.o.   MRN: [id number]  HPI: Roger Evans is a 48 y.o. male presenting on 01/17/2019 for Muscle Pain (C/o muscle ache in left breast. Area is tender to touch. Started about 3 wks ago. Thought maybe injured doing yardwork. ) and Immunizations (Pt noticed Td expired 12/24/08.)   3 wks ago while playing with children, something may have hit his R chest. Since then, tender area with palpation at nipple. R side feels different as well - possible small lump at nipple. Hasn't tried anything for this other than PRN ibuprofen. Recent yardwork, wonders if injured R chest wall - never bruised.  No other chest pain or dyspnea or dizziness, nausea, abd pain.   He regularly takes vit C supplement.  Requests Tdap today as due.     Relevant past medical, surgical, family and social history reviewed and updated as indicated. Interim medical history since our last visit reviewed. Allergies and medications reviewed and updated. Outpatient Medications Prior to Visit  Medication Sig Dispense Refill  . fluticasone (FLONASE) 50 MCG/ACT nasal spray Place 2 sprays into both nostrils daily. 16 g 1  . IBUPROFEN PO Take by mouth as needed.     No facility-administered medications prior to visit.      Per HPI unless specifically indicated in ROS section below Review of Systems Objective:    BP 126/70 (BP Location: Left Arm, Patient Position: Sitting, Cuff Size: Large)   Pulse 92   Temp 97.6 F (36.4 C) (Tympanic)   Ht 5' 11.5" (1.816 m)   Wt 253 lb 1 oz (114.8 kg)   SpO2 96%   BMI 34.80 kg/m   Wt Readings from Last 3 Encounters:  01/17/19 253 lb 1 oz (114.8  kg)  07/04/18 246 lb 8 oz (111.8 kg)  11/15/15 233 lb (105.7 kg)    Physical Exam Vitals signs and nursing note reviewed.  Constitutional:      Appearance: Normal appearance.  Chest:     Chest wall: Tenderness present.     Breasts:        Right: Swelling and tenderness present. No inverted nipple, mass, nipple discharge or skin change.        Left: Normal. No swelling, inverted nipple, mass, nipple discharge, skin change or tenderness.     Comments: Mild fullness underlying R nipple/areola that is tender to palpation Lymphadenopathy:     Upper Body:     Right upper body: No supraclavicular, axillary or pectoral adenopathy.     Left upper body: No supraclavicular, axillary or pectoral adenopathy.  Neurological:     Mental Status: He is alert.       Results for orders placed or performed in visit on 11/15/15  PSA  Result Value Ref Range   PSA 0.52 0.10 - 4.00 ng/mL  POCT Urinalysis Dipstick (Automated)  Result Value Ref Range   Color, UA YELLOW    Clarity, UA Clear    Glucose, UA Neg    Bilirubin, UA Neg    Ketones, UA Neg  Spec Grav, UA 1.025    Blood, UA neg    pH, UA 6.0    Protein, UA neg    Urobilinogen, UA 0.2    Nitrite, UA neg    Leukocytes, UA Negative Negative   Lab Results  Component Value Date   TSH 2.11 01/05/2010    Assessment & Plan:   Problem List Items Addressed This Visit    Gynecomastia, male - Primary    Story/exam most consistent with unilateral mild R sided gynecomastia given symmetrical central location of fullness. No inciting medications. Will start with hormonal evaluation, consider imaging if unrevealing. Suggested warm compresses to area. Pt agrees with plan.       Relevant Orders   Testosterone   Estradiol   Luteinizing hormone   TSH   HCG, Tumor Marker    Other Visit Diagnoses    Need for Tdap vaccination       Relevant Orders   Tdap vaccine greater than or equal to 7yo IM (Completed)       No orders of the defined types  were placed in this encounter.  Orders Placed This Encounter  Procedures  . Tdap vaccine greater than or equal to 7yo IM  . Testosterone  . Estradiol  . Luteinizing hormone  . TSH  . HCG, Tumor Marker    Follow up plan: No follow-ups on file.  Ria Bush, MD

## 2019-01-18 LAB — ESTRADIOL: Estradiol: 28 pg/mL (ref <=39)

## 2019-01-18 LAB — BETA HCG QUANT (REF LAB): hCG Quant: <1 m[IU]/mL (ref 0–3)

## 2019-01-21 ENCOUNTER — Encounter: Payer: Self-pay | Admitting: Family Medicine

## 2019-01-21 DIAGNOSIS — E291 Testicular hypofunction: Secondary | ICD-10-CM

## 2019-01-21 DIAGNOSIS — N62 Hypertrophy of breast: Secondary | ICD-10-CM

## 2019-01-30 ENCOUNTER — Encounter: Payer: Self-pay | Admitting: Family Medicine

## 2019-01-31 NOTE — Telephone Encounter (Signed)
plz check on endo appt for pt. Thank you!

## 2019-02-24 DIAGNOSIS — D2261 Melanocytic nevi of right upper limb, including shoulder: Secondary | ICD-10-CM | POA: Diagnosis not present

## 2019-02-24 DIAGNOSIS — D2272 Melanocytic nevi of left lower limb, including hip: Secondary | ICD-10-CM | POA: Diagnosis not present

## 2019-02-24 DIAGNOSIS — D225 Melanocytic nevi of trunk: Secondary | ICD-10-CM | POA: Diagnosis not present

## 2019-02-24 DIAGNOSIS — D2262 Melanocytic nevi of left upper limb, including shoulder: Secondary | ICD-10-CM | POA: Diagnosis not present

## 2019-02-24 DIAGNOSIS — L570 Actinic keratosis: Secondary | ICD-10-CM | POA: Diagnosis not present

## 2019-03-24 ENCOUNTER — Other Ambulatory Visit: Payer: Self-pay

## 2019-03-26 ENCOUNTER — Other Ambulatory Visit: Payer: Self-pay

## 2019-03-26 ENCOUNTER — Ambulatory Visit: Payer: BC Managed Care – PPO | Admitting: Endocrinology

## 2019-03-26 ENCOUNTER — Encounter: Payer: Self-pay | Admitting: Endocrinology

## 2019-03-26 VITALS — BP 122/72 | HR 88 | Ht 71.5 in | Wt 255.0 lb

## 2019-03-26 DIAGNOSIS — N62 Hypertrophy of breast: Secondary | ICD-10-CM

## 2019-03-26 DIAGNOSIS — E291 Testicular hypofunction: Secondary | ICD-10-CM | POA: Diagnosis not present

## 2019-03-26 LAB — BASIC METABOLIC PANEL
BUN: 20 mg/dL (ref 6–23)
CO2: 26 mEq/L (ref 19–32)
Calcium: 9.2 mg/dL (ref 8.4–10.5)
Chloride: 105 mEq/L (ref 96–112)
Creatinine, Ser: 1 mg/dL (ref 0.40–1.50)
GFR: 79.22 mL/min (ref >60.00)
Glucose, Bld: 94 mg/dL (ref 70–99)
Potassium: 4.4 mEq/L (ref 3.5–5.1)
Sodium: 140 mEq/L (ref 135–145)

## 2019-03-26 LAB — IBC PANEL
Iron: 124 ug/dL (ref 42–165)
Saturation Ratios: 39.7 % (ref 20.0–50.0)
Transferrin: 223 mg/dL (ref 212.0–360.0)

## 2019-03-26 NOTE — Progress Notes (Signed)
Subjective:    Patient ID: Roger Evans, male    DOB: 30-Sep-1970, 48 y.o.   MRN: [id number]  HPI Pt is referred by Dr Danise Mina, for hypogonadism.  Pt reports he had puberty at the normal age.  He has 2 biological children.  He has since had a vasectomy.  He says he has never taken illicit androgens.  He has never been on any prescribed medication for hypogonadism.  He does not take antiandrogens or opioids.  He denies any h/o infertility, XRT, or genital infection.  He has never had surgery, or a serious injury to the head or genital area.  He has no h/o sleep apnea or DVT.   He does not consume alcohol excessively.  he has few mos of slight swelling at the right breast, and assoc pain.   Past Medical History:  Diagnosis Date  . Radiculitis of left cervical region 2014   saw PT, Barnes - L arm weakness consistent with C6-7 cervical radiculitis confirmed by MRI and EMG (disc pressing on L C7 nerve root) offered neurosurgeon vs PM&R referral    Past Surgical History:  Procedure Laterality Date  . VASECTOMY  07/28/2005    Social History   Socioeconomic History  . Marital status: Married    Spouse name: Not on file  . Number of children: Not on file  . Years of education: Not on file  . Highest education level: Not on file  Occupational History  . Not on file  Social Needs  . Financial resource strain: Not on file  . Food insecurity    Worry: Not on file    Inability: Not on file  . Transportation needs    Medical: Not on file    Non-medical: Not on file  Tobacco Use  . Smoking status: Never Smoker  . Smokeless tobacco: Never Used  Substance and Sexual Activity  . Alcohol use: Yes    Comment: 2 beer a week  . Drug use: No  . Sexual activity: Not on file  Lifestyle  . Physical activity    Days per week: Not on file    Minutes per session: Not on file  . Stress: Not on file  Relationships  . Social Herbalist on phone: Not on file    Gets together: Not on  file    Attends religious service: Not on file    Active member of club or organization: Not on file    Attends meetings of clubs or organizations: Not on file    Relationship status: Not on file  . Intimate partner violence    Fear of current or ex partner: Not on file    Emotionally abused: Not on file    Physically abused: Not on file    Forced sexual activity: Not on file  Other Topics Concern  . Not on file  Social History Narrative   Married, 2 children,    Occupation: IT at [employer]   Activity: [employer]    Current Outpatient Medications on File Prior to Visit  Medication Sig Dispense Refill  . fluticasone (FLONASE) 50 MCG/ACT nasal spray Place 2 sprays into both nostrils daily. (Patient taking differently: Place 2 sprays into both nostrils daily as needed. ) 16 g 1  . ibuprofen (ADVIL) 200 MG tablet Take 1-2 tablets by mouth every 6 (six) hours as needed.      No current facility-administered medications on file prior to visit.  No Known Allergies  Family History  Problem Relation Age of Onset  . Diabetes Mother   . Other Neg Hx        low testosterone    BP 122/72 (BP Location: Left Arm, Patient Position: Sitting, Cuff Size: Large)   Pulse 88   Ht 5' 11.5" (1.816 m)   Wt 255 lb (115.7 kg)   SpO2 94%   BMI 35.07 kg/m    Review of Systems denies depression, numbness, erectile dysfunction, decreased urinary stream, muscle weakness, fever, headache, easy bruising, sob, rash, diplopia, rhinorrhea, and chest pain.  He has slight weight gain.       Objective:   Physical Exam VS: see vs page GEN: no distress HEAD: head: no deformity eyes: no periorbital swelling, no proptosis external nose and ears are normal NECK: supple, thyroid is not enlarged CHEST WALL: no deformity LUNGS: clear to auscultation BREASTS:  Slight palpable right gynecomastia.  There is bilat pseudogynecomastia.  CV: reg rate and rhythm, no murmur ABD: abdomen is soft,  nontender.  no hepatosplenomegaly.  not distended.  no hernia GENITALIA:  Normal male.   MUSCULOSKELETAL: muscle bulk and strength are grossly normal.  no obvious joint swelling.  gait is normal and steady EXTEMITIES: no deformity.  no leg edema PULSES: no carotid bruit NEURO:  cn 2-12 grossly intact.   readily moves all 4's.  sensation is intact to touch on all 4's SKIN:  Normal texture and temperature.  No rash or suspicious lesion is visible.  Normal hair distribution. NODES:  None palpable at the neck PSYCH: alert, well-oriented.  Does not appear anxious nor depressed.  Lab Results  Component Value Date   PSA 0.52 11/15/2015   Lab Results  Component Value Date   TSH 1.82 01/17/2019   Lab Results  Component Value Date   TESTOSTERONE 126.66 (L) 01/17/2019   I have reviewed outside records, and summarized: Pt was noted to have low testosterone, and referred here.  Pt reported right breast swelling, and labs were ordered.  We;;ness was also addressed.      Assessment & Plan:  Central hypogonadism, new to me, uncertain etiology. Gynecomastia, prob due to the above.  Check mammography.  Patient Instructions  Blood tests are requested for you today.  We'll let you know about the results.  Testosterone treatment has risks, including increased or decreased fertility (depending on the type of treatment), hair loss, prostate cancer, benign prostate enlargement, blood clots, liver problems, lower hdl ("good cholesterol"), polycythemia (opposite of anemia), sleep apnea, and behavior changes. Weight loss also helps the testosterone.

## 2019-03-26 NOTE — Patient Instructions (Addendum)
Blood tests are requested for you today.  We'll let you know about the results.   °Testosterone treatment has risks, including increased or decreased fertility (depending on the type of treatment), hair loss, prostate cancer, benign prostate enlargement, blood clots, liver problems, lower hdl ("good cholesterol"), polycythemia (opposite of anemia), sleep apnea, and behavior changes.   °Weight loss also helps the testosterone.   ° °

## 2019-03-27 LAB — PROLACTIN: Prolactin: 5.8 ng/mL (ref 2.0–18.0)

## 2019-03-29 ENCOUNTER — Encounter: Payer: Self-pay | Admitting: Endocrinology

## 2019-03-29 ENCOUNTER — Other Ambulatory Visit: Payer: Self-pay | Admitting: Endocrinology

## 2019-03-29 DIAGNOSIS — E291 Testicular hypofunction: Secondary | ICD-10-CM

## 2019-03-29 LAB — TESTOSTERONE,FREE AND TOTAL
Testosterone, Free: 3.3 pg/mL — ABNORMAL LOW (ref 6.8–21.5)
Testosterone: 169 ng/dL — ABNORMAL LOW (ref 264–916)

## 2019-04-01 NOTE — Telephone Encounter (Signed)
Please advise 

## 2019-04-02 ENCOUNTER — Other Ambulatory Visit: Payer: Self-pay | Admitting: Endocrinology

## 2019-04-02 DIAGNOSIS — N62 Hypertrophy of breast: Secondary | ICD-10-CM

## 2019-04-10 ENCOUNTER — Other Ambulatory Visit: Payer: Self-pay

## 2019-04-10 ENCOUNTER — Ambulatory Visit: Payer: BC Managed Care – PPO

## 2019-04-10 ENCOUNTER — Ambulatory Visit
Admission: RE | Admit: 2019-04-10 | Discharge: 2019-04-10 | Disposition: A | Discharge status: Home / Self Care | Payer: BC Managed Care – PPO | Source: Ambulatory Visit | Attending: Endocrinology | Admitting: Endocrinology

## 2019-04-10 DIAGNOSIS — R928 Other abnormal and inconclusive findings on diagnostic imaging of breast: Secondary | ICD-10-CM | POA: Diagnosis not present

## 2019-04-10 DIAGNOSIS — N62 Hypertrophy of breast: Secondary | ICD-10-CM

## 2019-05-02 ENCOUNTER — Ambulatory Visit
Admission: RE | Admit: 2019-05-02 | Discharge: 2019-05-02 | Disposition: A | Discharge status: Home / Self Care | Payer: BC Managed Care – PPO | Source: Ambulatory Visit | Attending: Endocrinology | Admitting: Endocrinology

## 2019-05-02 ENCOUNTER — Other Ambulatory Visit: Payer: Self-pay

## 2019-05-02 DIAGNOSIS — E291 Testicular hypofunction: Secondary | ICD-10-CM

## 2019-05-02 DIAGNOSIS — E236 Other disorders of pituitary gland: Secondary | ICD-10-CM | POA: Diagnosis not present

## 2019-05-02 MED ORDER — GADOBENATE DIMEGLUMINE 529 MG/ML IV SOLN
10.0000 mL | Freq: Once | INTRAVENOUS | Status: AC | PRN
  Administered 2019-05-02: 10 mL via INTRAVENOUS

## 2019-05-04 ENCOUNTER — Other Ambulatory Visit: Payer: Self-pay | Admitting: Endocrinology

## 2019-05-04 DIAGNOSIS — E291 Testicular hypofunction: Secondary | ICD-10-CM

## 2019-05-04 MED ORDER — CLOMIPHENE CITRATE 50 MG PO TABS: ORAL_TABLET | ORAL | 5 refills | Status: DC

## 2019-05-16 ENCOUNTER — Other Ambulatory Visit: Payer: Self-pay

## 2019-05-16 ENCOUNTER — Ambulatory Visit: Payer: Self-pay

## 2019-05-16 DIAGNOSIS — Z23 Encounter for immunization: Secondary | ICD-10-CM

## 2019-06-25 ENCOUNTER — Encounter: Payer: Self-pay | Admitting: Family Medicine

## 2019-06-25 DIAGNOSIS — D352 Benign neoplasm of pituitary gland: Secondary | ICD-10-CM | POA: Insufficient documentation

## 2019-06-30 ENCOUNTER — Telehealth: Payer: Self-pay

## 2019-06-30 NOTE — Telephone Encounter (Signed)
Lvm for pt to call back.  Needs to schedule an 8:00 lab visit this week.

## 2019-07-01 NOTE — Telephone Encounter (Signed)
Pt scheduled on 07/02/19 at 7:50.

## 2019-07-02 ENCOUNTER — Other Ambulatory Visit (INDEPENDENT_AMBULATORY_CARE_PROVIDER_SITE_OTHER): Payer: BC Managed Care – PPO

## 2019-07-02 ENCOUNTER — Other Ambulatory Visit: Payer: Self-pay

## 2019-07-02 ENCOUNTER — Other Ambulatory Visit: Payer: Self-pay | Admitting: Family Medicine

## 2019-07-02 DIAGNOSIS — E291 Testicular hypofunction: Secondary | ICD-10-CM

## 2019-07-02 DIAGNOSIS — D352 Benign neoplasm of pituitary gland: Secondary | ICD-10-CM

## 2019-07-02 DIAGNOSIS — E669 Obesity, unspecified: Secondary | ICD-10-CM

## 2019-07-02 LAB — LIPID PANEL
Cholesterol: 200 mg/dL (ref 0–200)
HDL: 49.1 mg/dL (ref >39.00)
LDL Cholesterol: 125 mg/dL — ABNORMAL HIGH (ref 0–99)
NonHDL: 150.96
Total CHOL/HDL Ratio: 4
Triglycerides: 128 mg/dL (ref 0.0–149.0)
VLDL: 25.6 mg/dL (ref 0.0–40.0)

## 2019-07-02 LAB — COMPREHENSIVE METABOLIC PANEL
ALT: 37 U/L (ref 0–53)
AST: 23 U/L (ref 0–37)
Albumin: 4.2 g/dL (ref 3.5–5.2)
Alkaline Phosphatase: 56 U/L (ref 39–117)
BUN: 19 mg/dL (ref 6–23)
CO2: 29 mEq/L (ref 19–32)
Calcium: 9.5 mg/dL (ref 8.4–10.5)
Chloride: 103 mEq/L (ref 96–112)
Creatinine, Ser: 1.12 mg/dL (ref 0.40–1.50)
GFR: 69.43 mL/min (ref >60.00)
Glucose, Bld: 105 mg/dL — ABNORMAL HIGH (ref 70–99)
Potassium: 4.5 mEq/L (ref 3.5–5.1)
Sodium: 140 mEq/L (ref 135–145)
Total Bilirubin: 0.5 mg/dL (ref 0.2–1.2)
Total Protein: 7.2 g/dL (ref 6.0–8.3)

## 2019-07-04 LAB — TESTOSTERONE,FREE AND TOTAL
Testosterone, Free: 18.5 pg/mL (ref 6.8–21.5)
Testosterone: 692 ng/dL (ref 264–916)

## 2020-02-23 DIAGNOSIS — D225 Melanocytic nevi of trunk: Secondary | ICD-10-CM | POA: Diagnosis not present

## 2020-02-23 DIAGNOSIS — D2261 Melanocytic nevi of right upper limb, including shoulder: Secondary | ICD-10-CM | POA: Diagnosis not present

## 2020-02-23 DIAGNOSIS — D2271 Melanocytic nevi of right lower limb, including hip: Secondary | ICD-10-CM | POA: Diagnosis not present

## 2020-02-23 DIAGNOSIS — D2262 Melanocytic nevi of left upper limb, including shoulder: Secondary | ICD-10-CM | POA: Diagnosis not present

## 2020-03-18 ENCOUNTER — Telehealth: Payer: Self-pay | Admitting: Family Medicine

## 2020-03-18 NOTE — Telephone Encounter (Signed)
It sounds like his sx are mild enough to await OV with PCP.  If worse in the meantime, then have him update us/seek care.  I think it is okay for him to be seen in clinic.  Thanks.

## 2020-03-18 NOTE — Telephone Encounter (Signed)
I spoke with pt; lower mid dull abd pain that is continuous started 03/14/20;pt had done yard work on 08/14/21and pt thought he might have pulled something but he is still having discomfort in lower abd.  pt had normal BM this morning with no blood or mucus and color was normal; not constipated BM. Now pt said he feels like he could have BM but when goes to restroom unable to have a BM. Never had this before; no urinary symptoms, no burning, pain, frequency of urine and no back pain. Pt does not feel like he is in any distress and appt was offered with another provider on 03/19/20 but pt declines because he wants to see Dr Darnell Level and he has made arrangements for appt on 03/24/20 already. Pt said starting in the summer pt thought having allergies and still has runny nose and when pt takes mask off he has dry cough for couple of times and then pt drinks water and cough stops. No fever and no other possible covid symptoms except ones listed above; no travel, no known exposure to covid, pt wears mask when in public and [facility] Modern[facility]; pt does not know dates but will bring covid vaccine card when comes for appt. UC & ED precautions given and pt voiced understanding. Due to abd pain,dry cough and runny nose sending note to Dr Darnell Level as PCP and Dr Damita Dunnings as being in office to verify it is OK for pt to come into office. Please call pt after reviewed by Dr Damita Dunnings to verify pt can wait until 03/24/20 for appt with UC & ED precautions given and to verify pt can come into office for the appt.

## 2020-03-18 NOTE — Telephone Encounter (Signed)
Patient scheduled appointment on 8/25 for lower abdominal pain since 8/15th. Please call and triage patient, thank you!

## 2020-03-18 NOTE — Telephone Encounter (Signed)
Pt notified as instructed and voiced understanding. 

## 2020-03-24 ENCOUNTER — Ambulatory Visit: Payer: BC Managed Care – PPO | Admitting: Family Medicine

## 2020-03-24 ENCOUNTER — Encounter: Payer: Self-pay | Admitting: Family Medicine

## 2020-03-24 ENCOUNTER — Other Ambulatory Visit: Payer: Self-pay

## 2020-03-24 ENCOUNTER — Ambulatory Visit (INDEPENDENT_AMBULATORY_CARE_PROVIDER_SITE_OTHER)
Admission: RE | Admit: 2020-03-24 | Discharge: 2020-03-24 | Disposition: A | Discharge status: Home / Self Care | Payer: BC Managed Care – PPO | Source: Ambulatory Visit | Attending: Family Medicine | Admitting: Family Medicine

## 2020-03-24 VITALS — BP 138/86 | HR 84 | Temp 98.1°F | Ht 71.5 in | Wt 255.1 lb

## 2020-03-24 DIAGNOSIS — R1030 Lower abdominal pain, unspecified: Secondary | ICD-10-CM

## 2020-03-24 DIAGNOSIS — E291 Testicular hypofunction: Secondary | ICD-10-CM | POA: Diagnosis not present

## 2020-03-24 DIAGNOSIS — I878 Other specified disorders of veins: Secondary | ICD-10-CM | POA: Diagnosis not present

## 2020-03-24 DIAGNOSIS — D352 Benign neoplasm of pituitary gland: Secondary | ICD-10-CM | POA: Diagnosis not present

## 2020-03-24 DIAGNOSIS — R109 Unspecified abdominal pain: Secondary | ICD-10-CM | POA: Diagnosis not present

## 2020-03-24 LAB — CBC WITH DIFFERENTIAL/PLATELET
Basophils Absolute: 0 10*3/uL (ref 0.0–0.1)
Basophils Relative: 0.6 % (ref 0.0–3.0)
Eosinophils Absolute: 0.2 10*3/uL (ref 0.0–0.7)
Eosinophils Relative: 3.4 % (ref 0.0–5.0)
HCT: 43.4 % (ref 39.0–52.0)
Hemoglobin: 14.7 g/dL (ref 13.0–17.0)
Lymphocytes Relative: 29.5 % (ref 12.0–46.0)
Lymphs Abs: 1.5 10*3/uL (ref 0.7–4.0)
MCHC: 33.8 g/dL (ref 30.0–36.0)
MCV: 86.1 fl (ref 78.0–100.0)
Monocytes Absolute: 0.6 10*3/uL (ref 0.1–1.0)
Monocytes Relative: 11.9 % (ref 3.0–12.0)
Neutro Abs: 2.8 10*3/uL (ref 1.4–7.7)
Neutrophils Relative %: 54.6 % (ref 43.0–77.0)
Platelets: 287 10*3/uL (ref 150.0–400.0)
RBC: 5.04 Mil/uL (ref 4.22–5.81)
RDW: 13.4 % (ref 11.5–15.5)
WBC: 5.2 10*3/uL (ref 4.0–10.5)

## 2020-03-24 LAB — COMPREHENSIVE METABOLIC PANEL
ALT: 43 U/L (ref 0–53)
AST: 29 U/L (ref 0–37)
Albumin: 4.3 g/dL (ref 3.5–5.2)
Alkaline Phosphatase: 63 U/L (ref 39–117)
BUN: 19 mg/dL (ref 6–23)
CO2: 30 mEq/L (ref 19–32)
Calcium: 9.4 mg/dL (ref 8.4–10.5)
Chloride: 105 mEq/L (ref 96–112)
Creatinine, Ser: 1.13 mg/dL (ref 0.40–1.50)
GFR: 68.52 mL/min (ref >60.00)
Glucose, Bld: 100 mg/dL — ABNORMAL HIGH (ref 70–99)
Potassium: 4.6 mEq/L (ref 3.5–5.1)
Sodium: 140 mEq/L (ref 135–145)
Total Bilirubin: 0.5 mg/dL (ref 0.2–1.2)
Total Protein: 7.5 g/dL (ref 6.0–8.3)

## 2020-03-24 LAB — POC URINALSYSI DIPSTICK (AUTOMATED)
Bilirubin, UA: NEGATIVE
Glucose, UA: NEGATIVE
Ketones, UA: NEGATIVE
Leukocytes, UA: NEGATIVE
Nitrite, UA: NEGATIVE
Protein, UA: NEGATIVE
Spec Grav, UA: >=1.03 — AB (ref 1.010–1.025)
Urobilinogen, UA: 0.2 E.U./dL
pH, UA: 5.5 (ref 5.0–8.0)

## 2020-03-24 NOTE — Assessment & Plan Note (Signed)
Overdue for endo f/u - advised to call and schedule.

## 2020-03-24 NOTE — Assessment & Plan Note (Addendum)
Ongoing for 1+ wks now. Overall reassuring exam, DRE largely normal. No red flags. Anticipate component of constipation. Discussed fiber and water in diet. Check labwork and 2 view abd for further evaluation today.  For increasing GER symptoms suggested daily pepcid for 1-2 wks and update with effect.

## 2020-03-24 NOTE — Patient Instructions (Addendum)
Call and schedule follow up with endocrinology.  Urinalysis today  Labs today  Xray of abdomen today.  We will be in touch with results  In interim, increase water intake, increase fiber in the diet or add fiber supplement like metamucil or benefiber daily.  For heartburn, add pepcid at night time for 1-2 weeks.

## 2020-03-24 NOTE — Assessment & Plan Note (Addendum)
Overdue for endo f/u - advised to call and schedule. Update free and total T today.

## 2020-03-24 NOTE — Progress Notes (Signed)
This visit was conducted in person.  BP 138/86 (BP Location: Left Arm, Patient Position: Sitting, Cuff Size: Large)   Pulse 84   Temp 98.1 F (36.7 C) (Temporal)   Ht 5' 11.5" (1.816 m)   Wt 255 lb 2 oz (115.7 kg)   SpO2 95%   BMI 35.09 kg/m    CC: lower abd pain  Subjective:    Patient ID: Roger Evans, male    DOB: Nov 27, 1970, 49 y.o.   MRN: [id number]  HPI: Roger Evans is a 49 y.o. male presenting on 03/24/2020 for Abdominal Pain (C/o low abd pain in hypogastric area.  Started 03/13/20.  Now described as discomfort.  Denies any N/V/D. )   10 days of lower abdominal pain along beltline that may have started after doing yard work on 03/13/2020. Denies inciting trauma or injury however. Pain did improve after a few days but discomfort has persisted. Tried some ibuprofen for this.   Also noticing increasing heartburn and acid reflux over the past few months.   Largely normal BMs without diarrhea/constipation or blood in stool. However recently noticing urge to go with trouble having BM. BMs have not changed. Last BM was this morning.   No urinary symptoms. Denies fevers/chills, nausea/vomiting, lower back or rectal pain, no changes to urinary stream.  Feels he drinks some water but could do better with this and fiber.   Hypogonadism previously on clomiphene through endo. Due for f/u.  Colon cancer screening - discussed colonoscopy.      Relevant past medical, surgical, family and social history reviewed and updated as indicated. Interim medical history since our last visit reviewed. Allergies and medications reviewed and updated. Outpatient Medications Prior to Visit  Medication Sig Dispense Refill  . fluticasone (FLONASE) 50 MCG/ACT nasal spray Place 2 sprays into both nostrils daily. (Patient taking differently: Place 2 sprays into both nostrils daily as needed. ) 16 g 1  . clomiPHENE (CLOMID) 50 MG tablet 1/4 tab daily 8 tablet 5  . ibuprofen (ADVIL) 200 MG tablet Take  1-2 tablets by mouth every 6 (six) hours as needed.      No facility-administered medications prior to visit.     Per HPI unless specifically indicated in ROS section below Review of Systems Objective:  BP 138/86 (BP Location: Left Arm, Patient Position: Sitting, Cuff Size: Large)   Pulse 84   Temp 98.1 F (36.7 C) (Temporal)   Ht 5' 11.5" (1.816 m)   Wt 255 lb 2 oz (115.7 kg)   SpO2 95%   BMI 35.09 kg/m   Wt Readings from Last 3 Encounters:  03/24/20 255 lb 2 oz (115.7 kg)  03/26/19 255 lb (115.7 kg)  01/17/19 253 lb 1 oz (114.8 kg)      Physical Exam Vitals and nursing note reviewed.  Constitutional:      Appearance: Normal appearance. He is not ill-appearing.  Cardiovascular:     Rate and Rhythm: Normal rate and regular rhythm.     Pulses: Normal pulses.     Heart sounds: Normal heart sounds. No murmur heard.   Pulmonary:     Effort: Pulmonary effort is normal. No respiratory distress.     Breath sounds: Normal breath sounds. No wheezing, rhonchi or rales.  Abdominal:     General: Abdomen is flat. Bowel sounds are normal. There is no distension.     Palpations: Abdomen is soft. There is no mass.     Tenderness: There is abdominal tenderness (mild) in  the suprapubic area. There is no right CVA tenderness, left CVA tenderness, guarding or rebound. Negative signs include Murphy's sign.     Hernia: No hernia is present.  Genitourinary:    Prostate: Normal. Not enlarged (20gm), not tender and no nodules present.     Rectum: Normal. No mass, tenderness, anal fissure, external hemorrhoid or internal hemorrhoid.  Musculoskeletal:        General: Normal range of motion.     Right lower leg: No edema.     Left lower leg: No edema.  Skin:    General: Skin is warm and dry.     Findings: No rash.  Neurological:     Mental Status: He is alert.  Psychiatric:        Mood and Affect: Mood normal.        Behavior: Behavior normal.       Results for orders placed or  performed in visit on 03/24/20  POCT Urinalysis Dipstick (Automated)  Result Value Ref Range   Color, UA yellow    Clarity, UA clear    Glucose, UA Negative Negative   Bilirubin, UA negative    Ketones, UA negative    Spec Grav, UA >=1.030 (A) 1.010 - 1.025   Blood, UA +/-    pH, UA 5.5 5.0 - 8.0   Protein, UA Negative Negative   Urobilinogen, UA 0.2 0.2 or 1.0 E.U./dL   Nitrite, UA negative    Leukocytes, UA Negative Negative   Assessment & Plan:  This visit occurred during the SARS-CoV-2 public health emergency.  Safety protocols were in place, including screening questions prior to the visit, additional usage of staff PPE, and extensive cleaning of exam room while observing appropriate contact time as indicated for disinfecting solutions.   Problem List Items Addressed This Visit    Pituitary microadenoma ([place])    Overdue for endo f/u - advised to call and schedule.       Lower abdominal pain - Primary    Ongoing for 1+ wks now. Overall reassuring exam, DRE largely normal. No red flags. Anticipate component of constipation. Discussed fiber and water in diet. Check labwork and 2 view abd for further evaluation today.  For increasing GER symptoms suggested daily pepcid for 1-2 wks and update with effect.       Relevant Orders   POCT Urinalysis Dipstick (Automated) (Completed)   DG Abd 2 Views   Comprehensive metabolic panel   CBC with Differential/Platelet   Testosterone Total,Free,Bio, Males   Hypogonadism male    Overdue for endo f/u - advised to call and schedule. Update free and total T today.           No orders of the defined types were placed in this encounter.  Orders Placed This Encounter  Procedures  . DG Abd 2 Views    Standing Status:   Future    Number of Occurrences:   1    Standing Expiration Date:   03/24/2021    Order Specific Question:   Reason for Exam (SYMPTOM  OR DIAGNOSIS REQUIRED)    Answer:   lower abd pain    Order Specific Question:    Preferred imaging location?    Answer:   [facility]    Order Specific Question:   Radiology Contrast Protocol - do NOT remove file path    Answer:   [email or web address]  . Comprehensive metabolic panel  . CBC with Differential/Platelet  . Testosterone Total,Free,Bio, Males  . POCT  Urinalysis Dipstick (Automated)    Patient Instructions  Call and schedule follow up with endocrinology.  Urinalysis today  Labs today  Xray of abdomen today.  We will be in touch with results  In interim, increase water intake, increase fiber in the diet or add fiber supplement like metamucil or benefiber daily.  For heartburn, add pepcid at night time for 1-2 weeks.    Follow up plan: No follow-ups on file.  Ria Bush, MD

## 2020-03-25 LAB — TESTOSTERONE TOTAL,FREE,BIO, MALES
Albumin: 4.5 g/dL (ref 3.6–5.1)
Sex Hormone Binding: 29 nmol/L (ref 10–50)
Testosterone, Bioavailable: 101.5 ng/dL — ABNORMAL LOW (ref >=110.0)
Testosterone, Free: 49.4 pg/mL (ref 46.0–224.0)
Testosterone: 346 ng/dL (ref 250–827)

## 2020-06-09 IMAGING — MG MM DIGITAL DIAGNOSTIC BILAT W/ TOMO W/ CAD
6 of 10 series · 6 of 30 positions shown · non-contrast
Comparison: Previous exam(s).

CLINICAL DATA: 48-year-old male with right retroareolar tenderness
for approximately 6 months. No palpable abnormalities.

EXAM:
DIGITAL DIAGNOSTIC BILATERAL MAMMOGRAM WITH CAD AND TOMO

[R TAN synth-2D]
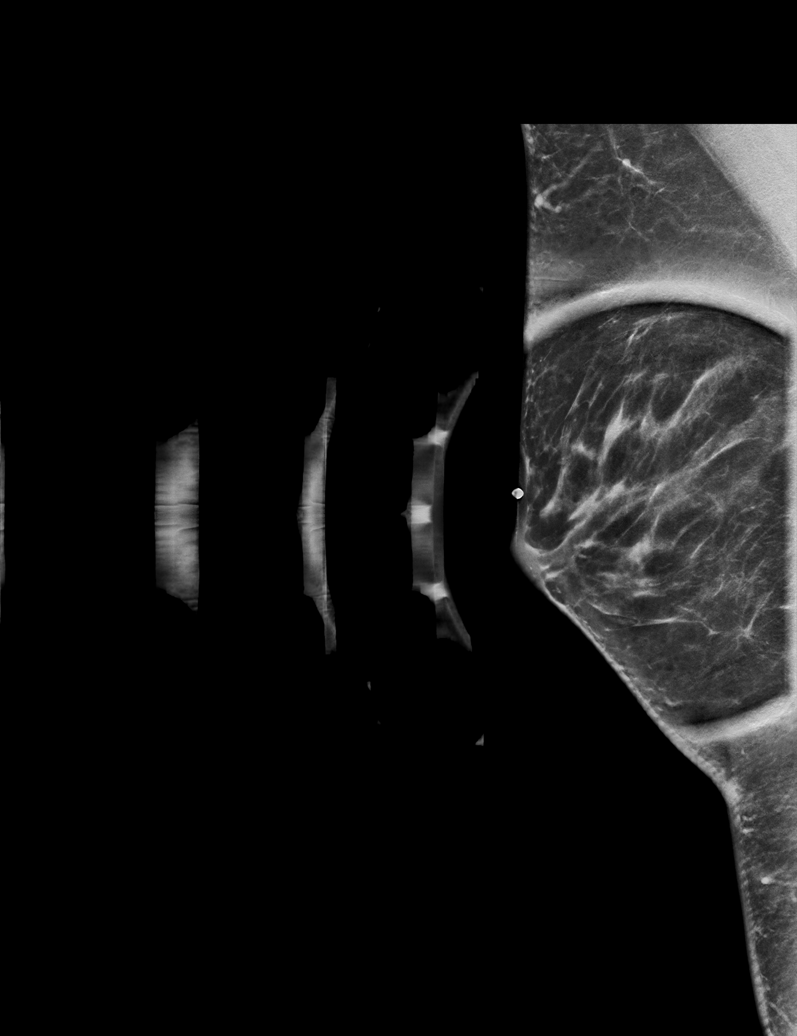

[L MLO synth-2D]
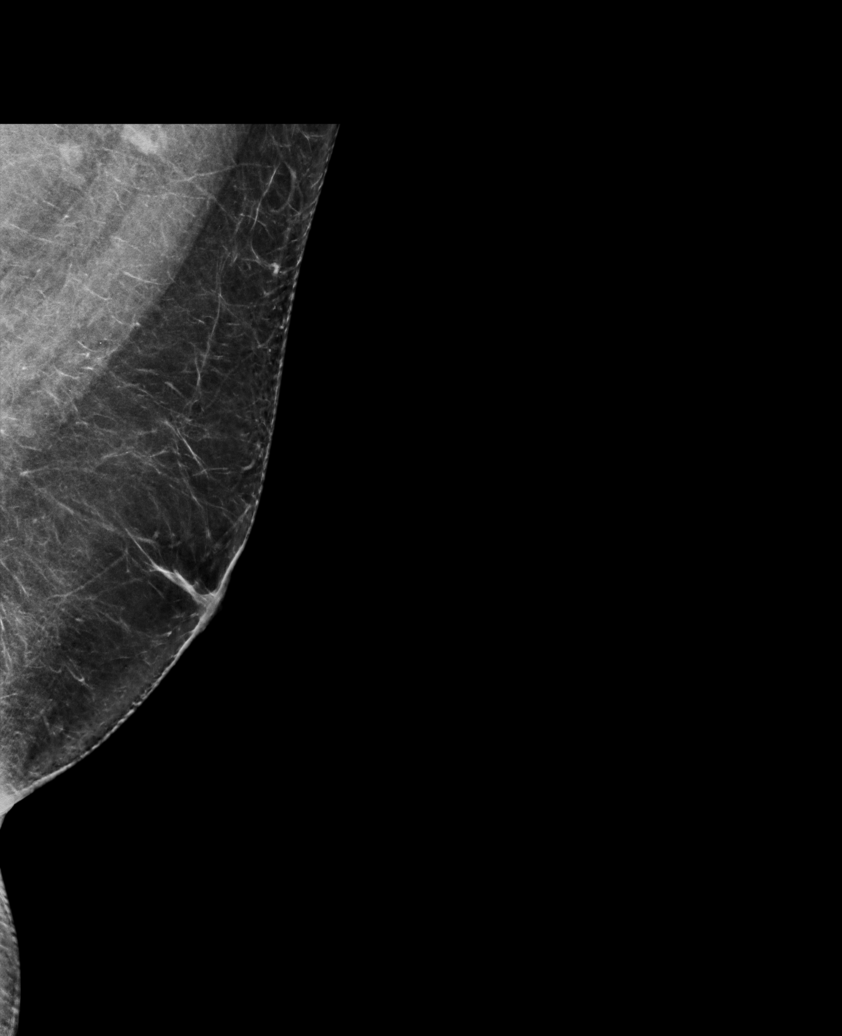

[L CC synth-2D]
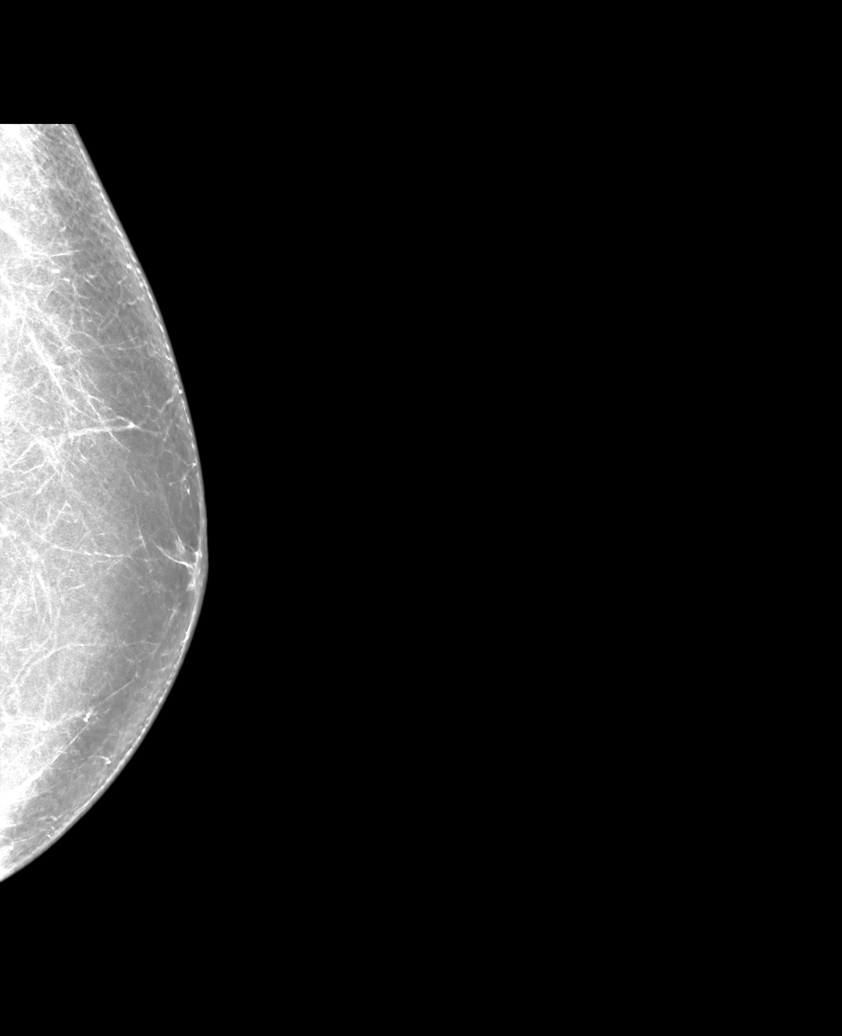

[R MLO synth-2D]
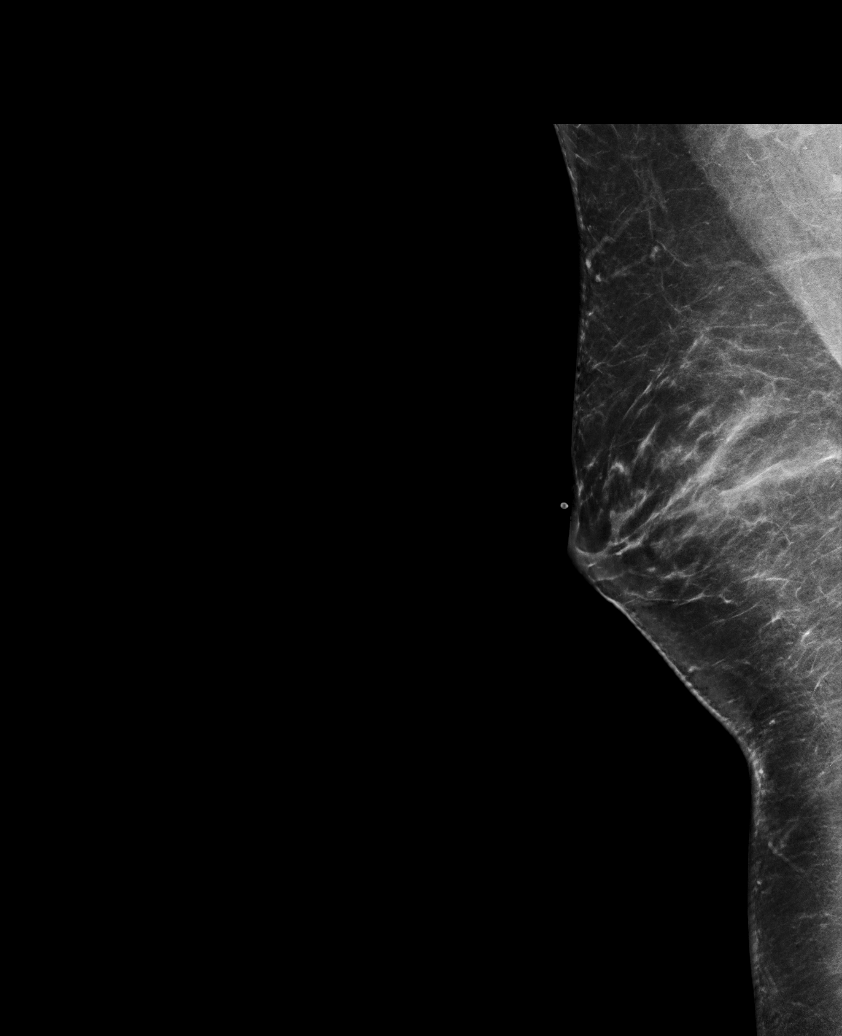

[R CC synth-2D]
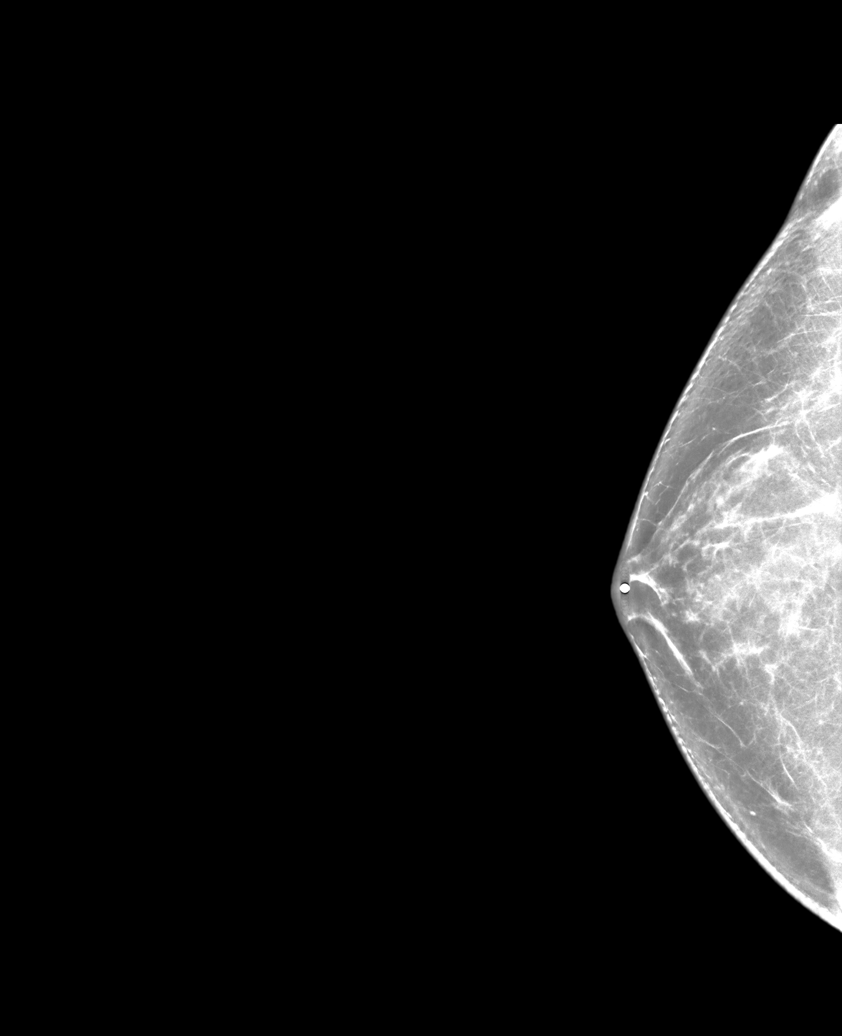

[R MLO tomo · tomo slice 47/93.0]
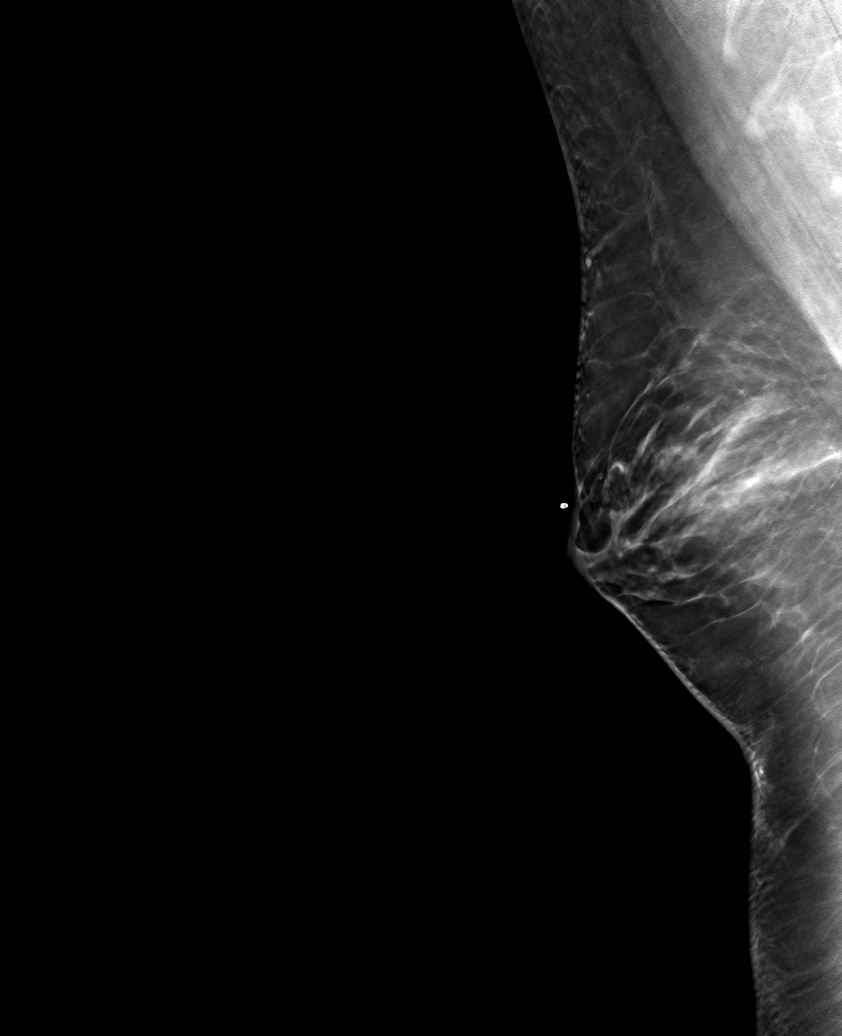

[6 of 30 positions shown; findings below may reference images not displayed]

ACR Breast Density Category b: There are scattered areas of
fibroglandular density.
FINDINGS: No suspicious masses or calcifications identified in either breast.
There is a right retroareolar flame shaped density corresponding to
the area tenderness with findings most compatible with benign
gynecomastia. There are no findings of malignancy in either breast.

Mammographic images were processed with CAD.

Physical examination reveals soft mobile tissue in the retroareolar
right breast. No underlying palpable abnormalities.
IMPRESSION: Gynecomastia, accounting for the right retroareolar tenderness.
There are no findings of malignancy in either breast.

RECOMMENDATION:
Clinical follow-up.

I have discussed the findings and recommendations with the patient.
If applicable, a reminder letter will be sent to the patient
regarding the next appointment.

BI-RADS CATEGORY  2: Benign.

## 2020-06-29 DIAGNOSIS — Z23 Encounter for immunization: Secondary | ICD-10-CM | POA: Diagnosis not present

## 2021-02-16 DIAGNOSIS — D2272 Melanocytic nevi of left lower limb, including hip: Secondary | ICD-10-CM | POA: Diagnosis not present

## 2021-02-16 DIAGNOSIS — D225 Melanocytic nevi of trunk: Secondary | ICD-10-CM | POA: Diagnosis not present

## 2021-02-16 DIAGNOSIS — D2261 Melanocytic nevi of right upper limb, including shoulder: Secondary | ICD-10-CM | POA: Diagnosis not present

## 2021-02-16 DIAGNOSIS — D2262 Melanocytic nevi of left upper limb, including shoulder: Secondary | ICD-10-CM | POA: Diagnosis not present

## 2021-04-15 DIAGNOSIS — Z23 Encounter for immunization: Secondary | ICD-10-CM | POA: Diagnosis not present

## 2021-04-20 ENCOUNTER — Ambulatory Visit: Payer: Self-pay

## 2021-04-20 ENCOUNTER — Other Ambulatory Visit: Payer: Self-pay

## 2021-05-04 ENCOUNTER — Other Ambulatory Visit: Payer: Self-pay

## 2021-05-04 ENCOUNTER — Ambulatory Visit: Payer: BC Managed Care – PPO

## 2021-05-04 DIAGNOSIS — Z23 Encounter for immunization: Secondary | ICD-10-CM

## 2021-06-10 DIAGNOSIS — Z3009 Encounter for other general counseling and advice on contraception: Secondary | ICD-10-CM | POA: Diagnosis not present

## 2021-07-14 DIAGNOSIS — Z30017 Encounter for initial prescription of implantable subdermal contraceptive: Secondary | ICD-10-CM | POA: Diagnosis not present

## 2022-02-16 DIAGNOSIS — D2261 Melanocytic nevi of right upper limb, including shoulder: Secondary | ICD-10-CM | POA: Diagnosis not present

## 2022-02-16 DIAGNOSIS — D485 Neoplasm of uncertain behavior of skin: Secondary | ICD-10-CM | POA: Diagnosis not present

## 2022-02-16 DIAGNOSIS — D225 Melanocytic nevi of trunk: Secondary | ICD-10-CM | POA: Diagnosis not present

## 2022-02-16 DIAGNOSIS — D2262 Melanocytic nevi of left upper limb, including shoulder: Secondary | ICD-10-CM | POA: Diagnosis not present

## 2022-02-16 DIAGNOSIS — D2271 Melanocytic nevi of right lower limb, including hip: Secondary | ICD-10-CM | POA: Diagnosis not present

## 2022-02-16 DIAGNOSIS — L439 Lichen planus, unspecified: Secondary | ICD-10-CM | POA: Diagnosis not present

## 2022-05-30 DIAGNOSIS — Z23 Encounter for immunization: Secondary | ICD-10-CM | POA: Diagnosis not present

## 2022-08-28 ENCOUNTER — Telehealth: Payer: BC Managed Care – PPO | Admitting: Family Medicine

## 2022-08-28 DIAGNOSIS — J019 Acute sinusitis, unspecified: Secondary | ICD-10-CM | POA: Diagnosis not present

## 2022-08-28 DIAGNOSIS — B9689 Other specified bacterial agents as the cause of diseases classified elsewhere: Secondary | ICD-10-CM

## 2022-08-28 MED ORDER — PSEUDOEPH-BROMPHEN-DM 30-2-10 MG/5ML PO SYRP: 5.0000 mL | ORAL_SOLUTION | Freq: Four times a day (QID) | ORAL | 0 refills | Status: DC | PRN

## 2022-08-28 MED ORDER — AMOXICILLIN-POT CLAVULANATE 875-125 MG PO TABS: 1.0000 | ORAL_TABLET | Freq: Two times a day (BID) | ORAL | 0 refills | Status: AC

## 2022-08-28 NOTE — Progress Notes (Signed)
E-Visit for Sinus Problems  We are sorry that you are not feeling well.  Here is how we plan to help!  Based on what you have shared with me it looks like you have sinusitis.  Sinusitis is inflammation and infection in the sinus cavities of the head.  Based on your presentation I believe you most likely have Acute Bacterial Sinusitis.  This is an infection caused by bacteria and is treated with antibiotics. I have prescribed Augmentin '875mg'$ /'125mg'$  one tablet twice daily with food, for 7 days. To be filled in 2 days if not improving. I will also send in a cough syrup to take- please do not take it with other cough suppressing medications. I recommend you start Flonase as well.  You may use an oral decongestant such as Mucinex -plain version. Saline nasal spray help and can safely be used as often as needed for congestion.  If you develop worsening sinus pain, fever or notice severe headache and vision changes, or if symptoms are not better after completion of antibiotic, please schedule an appointment with a health care provider.    Sinus infections are not as easily transmitted as other respiratory infection, however we still recommend that you avoid close contact with loved ones, especially the very young and elderly.  Remember to wash your hands thoroughly throughout the day as this is the number one way to prevent the spread of infection!  Home Care: Only take medications as instructed by your medical team. Complete the entire course of an antibiotic. Do not take these medications with alcohol. A steam or ultrasonic humidifier can help congestion.  You can place a towel over your head and breathe in the steam from hot water coming from a faucet. Avoid close contacts especially the very young and the elderly. Cover your mouth when you cough or sneeze. Always remember to wash your hands.  Get Help Right Away If: You develop worsening fever or sinus pain. You develop a severe head ache or visual  changes. Your symptoms persist after you have completed your treatment plan.  Make sure you Understand these instructions. Will watch your condition. Will get help right away if you are not doing well or get worse.  Thank you for choosing an e-visit.  Your e-visit answers were reviewed by a board certified advanced clinical practitioner to complete your personal care plan. Depending upon the condition, your plan could have included both over the counter or prescription medications.  Please review your pharmacy choice. Make sure the pharmacy is open so you can pick up prescription now. If there is a problem, you may contact your provider through [employer] and have the prescription routed to another pharmacy.  Your safety is important to [place]. If you have drug allergies check your prescription carefully.   For the next 24 hours you can use MyChart to ask questions about today's visit, request a non-urgent call back, or ask for a work or school excuse. You will get an email in the next two days asking about your experience. I hope that your e-visit has been valuable and will speed your recovery.  I provided 5 minutes of non face-to-face time during this encounter for chart review, medication and order placement, as well as and documentation.

## 2022-09-25 ENCOUNTER — Encounter: Payer: Self-pay | Admitting: Family Medicine

## 2022-09-25 ENCOUNTER — Ambulatory Visit: Payer: BC Managed Care – PPO | Admitting: Family Medicine

## 2022-09-25 VITALS — BP 138/86 | HR 105 | Temp 97.2°F | Ht 71.5 in | Wt 261.1 lb

## 2022-09-25 DIAGNOSIS — N62 Hypertrophy of breast: Secondary | ICD-10-CM

## 2022-09-25 DIAGNOSIS — D352 Benign neoplasm of pituitary gland: Secondary | ICD-10-CM | POA: Diagnosis not present

## 2022-09-25 DIAGNOSIS — E291 Testicular hypofunction: Secondary | ICD-10-CM

## 2022-09-25 NOTE — Progress Notes (Signed)
Patient ID: Roger Evans, male    DOB: 1970-10-14, 52 y.o.   MRN: [id number]  This visit was conducted in person.  BP 138/86   Pulse (!) 105   Temp (!) 97.2 F (36.2 C) (Temporal)   Ht 5' 11.5" (1.816 m)   Wt 261 lb 2 oz (118.4 kg)   SpO2 96%   BMI 35.91 kg/m    CC: check hormones Subjective:   HPI: Roger Evans is a 52 y.o. male presenting on 09/25/2022 for Medical Management of Chronic Issues (Here for hormone f/u.)   Last seen 02/2020.  H/o secondary hypogonadism presenting with R gynecomastia, previously on clomiphene through endocrinology - previously saw Dr Loanne Drilling, last seen 03/2019. Only took for a few months, this resolved initial gynecomastia.   Over the past 1-2 months notes tender R gynecomastia returning. No left sided symptoms.   Notes chronic intermittent retro orbital pressure headaches without double vision.  Notes ongoing fatigue, without depressed mood, or decreased libido.       Relevant past medical, surgical, family and social history reviewed and updated as indicated. Interim medical history since our last visit reviewed. Allergies and medications reviewed and updated. Outpatient Medications Prior to Visit  Medication Sig Dispense Refill   fluticasone (FLONASE) 50 MCG/ACT nasal spray Place 2 sprays into both nostrils daily. (Patient taking differently: Place 2 sprays into both nostrils daily as needed.) 16 g 1   brompheniramine-pseudoephedrine-DM 30-2-10 MG/5ML syrup Take 5 mLs by mouth 4 (four) times daily as needed. 120 mL 0   No facility-administered medications prior to visit.     Per HPI unless specifically indicated in ROS section below Review of Systems  Objective:  BP 138/86   Pulse (!) 105   Temp (!) 97.2 F (36.2 C) (Temporal)   Ht 5' 11.5" (1.816 m)   Wt 261 lb 2 oz (118.4 kg)   SpO2 96%   BMI 35.91 kg/m   Wt Readings from Last 3 Encounters:  09/25/22 261 lb 2 oz (118.4 kg)  03/24/20 255 lb 2 oz (115.7 kg)  03/26/19 255 lb  (115.7 kg)      Physical Exam Vitals and nursing note reviewed.  Constitutional:      Appearance: Normal appearance. He is not ill-appearing.  HENT:     Head: Normocephalic and atraumatic.     Mouth/Throat:     Mouth: Mucous membranes are moist.     Pharynx: Oropharynx is clear. No oropharyngeal exudate or posterior oropharyngeal erythema.  Eyes:     Extraocular Movements: Extraocular movements intact.     Pupils: Pupils are equal, round, and reactive to light.  Neck:     Thyroid: No thyroid mass or thyromegaly.  Cardiovascular:     Rate and Rhythm: Normal rate and regular rhythm.     Pulses: Normal pulses.     Heart sounds: Normal heart sounds. No murmur heard. Pulmonary:     Effort: Pulmonary effort is normal. No respiratory distress.     Breath sounds: Normal breath sounds. No wheezing, rhonchi or rales.  Chest:  Breasts:    Right: Swelling and tenderness present. No inverted nipple, mass or skin change.     Left: Normal. No swelling, inverted nipple, mass, skin change or tenderness.       Comments: Symmetric central subareolar tender soft tissue swelling without discrete mass Musculoskeletal:     Cervical back: Normal range of motion and neck supple.     Right lower leg: No edema.  Left lower leg: No edema.  Lymphadenopathy:     Upper Body:     Right upper body: No supraclavicular, axillary or pectoral adenopathy.     Left upper body: No supraclavicular, axillary or pectoral adenopathy.  Skin:    General: Skin is warm and dry.     Findings: No rash.  Neurological:     Mental Status: He is alert.     Cranial Nerves: Cranial nerves 2-12 are intact.     Sensory: Sensation is intact.     Motor: Motor function is intact.     Coordination: Coordination is intact.     Gait: Gait is intact.     Comments:  CN 2-12 intact FTN intact EOMI  Psychiatric:        Mood and Affect: Mood normal.        Behavior: Behavior normal.       Results for orders placed or  performed in visit on 03/24/20  Comprehensive metabolic panel  Result Value Ref Range   Sodium 140 135 - 145 mEq/L   Potassium 4.6 3.5 - 5.1 mEq/L   Chloride 105 96 - 112 mEq/L   CO2 30 19 - 32 mEq/L   Glucose, Bld 100 (H) 70 - 99 mg/dL   BUN 19 6 - 23 mg/dL   Creatinine, Ser 1.13 0.40 - 1.50 mg/dL   Total Bilirubin 0.5 0.2 - 1.2 mg/dL   Alkaline Phosphatase 63 39 - 117 U/L   AST 29 0 - 37 U/L   ALT 43 0 - 53 U/L   Total Protein 7.5 6.0 - 8.3 g/dL   Albumin 4.3 3.5 - 5.2 g/dL   GFR 68.52 >60.00 mL/min   Calcium 9.4 8.4 - 10.5 mg/dL  CBC with Differential/Platelet  Result Value Ref Range   WBC 5.2 4.0 - 10.5 K/uL   RBC 5.04 4.22 - 5.81 Mil/uL   Hemoglobin 14.7 13.0 - 17.0 g/dL   HCT 43.4 39.0 - 52.0 %   MCV 86.1 78.0 - 100.0 fl   MCHC 33.8 30.0 - 36.0 g/dL   RDW 13.4 11.5 - 15.5 %   Platelets 287.0 150.0 - 400.0 K/uL   Neutrophils Relative % 54.6 43.0 - 77.0 %   Lymphocytes Relative 29.5 12.0 - 46.0 %   Monocytes Relative 11.9 3.0 - 12.0 %   Eosinophils Relative 3.4 0.0 - 5.0 %   Basophils Relative 0.6 0.0 - 3.0 %   Neutro Abs 2.8 1.4 - 7.7 K/uL   Lymphs Abs 1.5 0.7 - 4.0 K/uL   Monocytes Absolute 0.6 0.1 - 1.0 K/uL   Eosinophils Absolute 0.2 0.0 - 0.7 K/uL   Basophils Absolute 0.0 0.0 - 0.1 K/uL  Testosterone Total,Free,Bio, Males  Result Value Ref Range   Testosterone 346 250 - 827 ng/dL   Albumin 4.5 3.6 - 5.1 g/dL   Sex Hormone Binding 29 10 - 50 nmol/L   Testosterone, Free 49.4 46.0 - 224.0 pg/mL   Testosterone, Bioavailable 101.5 (L) 110.0 - 575 ng/dL  POCT Urinalysis Dipstick (Automated)  Result Value Ref Range   Color, UA yellow    Clarity, UA clear    Glucose, UA Negative Negative   Bilirubin, UA negative    Ketones, UA negative    Spec Grav, UA >=1.030 (A) 1.010 - 1.025   Blood, UA +/-    pH, UA 5.5 5.0 - 8.0   Protein, UA Negative Negative   Urobilinogen, UA 0.2 0.2 or 1.0 E.U./dL   Nitrite, UA negative  Leukocytes, UA Negative Negative    MRI  brain w/w/o contrast 05/2019 IMPRESSION: 1. Appearance suspicious for pituitary microadenoma: two small areas of hypoenhancement are noted within the pituitary gland on delayed postcontrast images ranging from 2-4 mm. 2. Otherwise normal for age MRI appearance of the brain.  Assessment & Plan:   Problem List Items Addressed This Visit     Gynecomastia, male - Primary    Previously seemed to have resolved while taking clomifene (2020), symptoms recurring.  Exam consistent with recurrent unilateral R tender gynecomastia.  Will refer back to endo.       Relevant Orders   Ambulatory referral to Endocrinology   TSH   Testosterone   Prolactin   Luteinizing hormone   IBC panel   Estradiol   HCG, Tumor Marker   Hypogonadism male    H/o this.  Will return for 8am labs for new hormone baseline.  Will also refer back to endo.       Relevant Orders   Ambulatory referral to Endocrinology   TSH   Testosterone   Prolactin   Luteinizing hormone   IBC panel   Estradiol   HCG, Tumor Marker   Pituitary microadenoma ([place])    ?of this on MRI 2020 - update labwork, refer to endo to re -establish care.       Relevant Orders   Ambulatory referral to Endocrinology   Prolactin     No orders of the defined types were placed in this encounter.   Orders Placed This Encounter  Procedures   TSH    Standing Status:   Future    Standing Expiration Date:   09/26/2023   Testosterone    Standing Status:   Future    Standing Expiration Date:   09/26/2023   Prolactin    Standing Status:   Future    Standing Expiration Date:   09/26/2023   Luteinizing hormone    Standing Status:   Future    Standing Expiration Date:   09/26/2023   IBC panel    Standing Status:   Future    Standing Expiration Date:   09/26/2023   Estradiol    Standing Status:   Future    Standing Expiration Date:   09/26/2023   HCG, Tumor Marker    Standing Status:   Future    Standing Expiration Date:   09/26/2023    Ambulatory referral to Endocrinology    Referral Priority:   Routine    Referral Type:   Consultation    Referral Reason:   Specialty Services Required    Number of Visits Requested:   1    Patient Instructions  Schedule physical at your convenience.  Schedule early morning labwork one day this week for new hormone baseline.  We will likely refer you back to endocrinology for treatment. Good to see you today.   Follow up plan: Return if symptoms worsen or fail to improve.  Ria Bush, MD

## 2022-09-25 NOTE — Assessment & Plan Note (Signed)
?  of this on MRI 2020 - update labwork, refer to endo to re -establish care.

## 2022-09-25 NOTE — Assessment & Plan Note (Signed)
H/o this.  Will return for 8am labs for new hormone baseline.  Will also refer back to endo.

## 2022-09-25 NOTE — Patient Instructions (Addendum)
Schedule physical at your convenience.  Schedule early morning labwork one day this week for new hormone baseline.  We will likely refer you back to endocrinology for treatment. Good to see you today.

## 2022-09-25 NOTE — Assessment & Plan Note (Signed)
Previously seemed to have resolved while taking clomifene (2020), symptoms recurring.  Exam consistent with recurrent unilateral R tender gynecomastia.  Will refer back to endo.

## 2022-09-26 ENCOUNTER — Encounter: Payer: Self-pay | Admitting: *Deleted

## 2022-09-27 ENCOUNTER — Other Ambulatory Visit (INDEPENDENT_AMBULATORY_CARE_PROVIDER_SITE_OTHER): Payer: BC Managed Care – PPO

## 2022-09-27 DIAGNOSIS — N62 Hypertrophy of breast: Secondary | ICD-10-CM

## 2022-09-27 DIAGNOSIS — E291 Testicular hypofunction: Secondary | ICD-10-CM | POA: Diagnosis not present

## 2022-09-27 DIAGNOSIS — D352 Benign neoplasm of pituitary gland: Secondary | ICD-10-CM

## 2022-09-27 LAB — TESTOSTERONE: Testosterone: 290.43 ng/dL — ABNORMAL LOW (ref 300.00–890.00)

## 2022-09-27 LAB — IBC PANEL
Iron: 132 ug/dL (ref 42–165)
Saturation Ratios: 41.5 % (ref 20.0–50.0)
TIBC: 317.8 ug/dL (ref 250.0–450.0)
Transferrin: 227 mg/dL (ref 212.0–360.0)

## 2022-09-27 LAB — TSH: TSH: 4.49 u[IU]/mL (ref 0.35–5.50)

## 2022-09-27 LAB — LUTEINIZING HORMONE: LH: 0.83 m[IU]/mL — ABNORMAL LOW (ref 1.50–9.30)

## 2022-09-28 LAB — PROLACTIN: Prolactin: 7.7 ng/mL (ref 2.0–18.0)

## 2022-09-28 LAB — ESTRADIOL: Estradiol: 29 pg/mL (ref <=39)

## 2022-09-28 LAB — BETA HCG QUANT (REF LAB): hCG Quant: <1 m[IU]/mL (ref 0–3)

## 2022-11-29 DIAGNOSIS — R7309 Other abnormal glucose: Secondary | ICD-10-CM | POA: Diagnosis not present

## 2022-12-12 ENCOUNTER — Other Ambulatory Visit: Payer: Self-pay | Admitting: Family Medicine

## 2022-12-12 ENCOUNTER — Other Ambulatory Visit (INDEPENDENT_AMBULATORY_CARE_PROVIDER_SITE_OTHER): Payer: BC Managed Care – PPO

## 2022-12-12 DIAGNOSIS — Z125 Encounter for screening for malignant neoplasm of prostate: Secondary | ICD-10-CM | POA: Diagnosis not present

## 2022-12-12 DIAGNOSIS — Z1159 Encounter for screening for other viral diseases: Secondary | ICD-10-CM

## 2022-12-12 DIAGNOSIS — E669 Obesity, unspecified: Secondary | ICD-10-CM

## 2022-12-12 DIAGNOSIS — E291 Testicular hypofunction: Secondary | ICD-10-CM

## 2022-12-12 LAB — COMPREHENSIVE METABOLIC PANEL
ALT: 45 U/L (ref 0–53)
AST: 27 U/L (ref 0–37)
Albumin: 4.4 g/dL (ref 3.5–5.2)
Alkaline Phosphatase: 68 U/L (ref 39–117)
BUN: 17 mg/dL (ref 6–23)
CO2: 26 mEq/L (ref 19–32)
Calcium: 9.5 mg/dL (ref 8.4–10.5)
Chloride: 103 mEq/L (ref 96–112)
Creatinine, Ser: 0.98 mg/dL (ref 0.40–1.50)
GFR: 88.15 mL/min (ref >60.00)
Glucose, Bld: 88 mg/dL (ref 70–99)
Potassium: 4.3 mEq/L (ref 3.5–5.1)
Sodium: 138 mEq/L (ref 135–145)
Total Bilirubin: 0.6 mg/dL (ref 0.2–1.2)
Total Protein: 7.3 g/dL (ref 6.0–8.3)

## 2022-12-12 LAB — LIPID PANEL
Cholesterol: 198 mg/dL (ref 0–200)
HDL: 58.4 mg/dL (ref >39.00)
LDL Cholesterol: 119 mg/dL — ABNORMAL HIGH (ref 0–99)
NonHDL: 139.46
Total CHOL/HDL Ratio: 3
Triglycerides: 104 mg/dL (ref 0.0–149.0)
VLDL: 20.8 mg/dL (ref 0.0–40.0)

## 2022-12-12 LAB — PSA: PSA: 0.64 ng/mL (ref 0.10–4.00)

## 2022-12-13 LAB — HEPATITIS C ANTIBODY: Hepatitis C Ab: NONREACTIVE

## 2022-12-19 ENCOUNTER — Ambulatory Visit (INDEPENDENT_AMBULATORY_CARE_PROVIDER_SITE_OTHER): Payer: BC Managed Care – PPO | Admitting: Family Medicine

## 2022-12-19 ENCOUNTER — Encounter: Payer: Self-pay | Admitting: Family Medicine

## 2022-12-19 VITALS — BP 164/112 | HR 89 | Temp 97.8°F | Ht 71.5 in | Wt 258.0 lb

## 2022-12-19 DIAGNOSIS — N62 Hypertrophy of breast: Secondary | ICD-10-CM | POA: Diagnosis not present

## 2022-12-19 DIAGNOSIS — I10 Essential (primary) hypertension: Secondary | ICD-10-CM | POA: Diagnosis not present

## 2022-12-19 DIAGNOSIS — Z0001 Encounter for general adult medical examination with abnormal findings: Secondary | ICD-10-CM

## 2022-12-19 DIAGNOSIS — D352 Benign neoplasm of pituitary gland: Secondary | ICD-10-CM

## 2022-12-19 DIAGNOSIS — Z1211 Encounter for screening for malignant neoplasm of colon: Secondary | ICD-10-CM

## 2022-12-19 DIAGNOSIS — E291 Testicular hypofunction: Secondary | ICD-10-CM

## 2022-12-19 MED ORDER — AMLODIPINE BESYLATE 5 MG PO TABS: 5.0000 mg | ORAL_TABLET | Freq: Every day | ORAL | 3 refills | Status: DC

## 2022-12-19 MED ORDER — FAMOTIDINE 20 MG PO TABS: 20.0000 mg | ORAL_TABLET | Freq: Every day | ORAL | Status: AC | PRN

## 2022-12-19 NOTE — Assessment & Plan Note (Signed)
First R sided now bilateral, tender. Anticipate secondary hypogonadism contributing as per below.

## 2022-12-19 NOTE — Assessment & Plan Note (Addendum)
Preventative protocols reviewed and updated unless pt declined. Discussed healthy diet and lifestyle.  Requests colonoscopy referral.

## 2022-12-19 NOTE — Assessment & Plan Note (Signed)
H/o this previously saw [facility] Dr Everardo All.  Pending re-establishing with endo.

## 2022-12-19 NOTE — Patient Instructions (Addendum)
We will see if [facility] has any sooner appointments.  Will refer to [facility] to schedule colonoscopy - you may call at [phone] to schedule appointment.   Consider shingrix vaccine series.  BP was too high - buy home BP cuff. Start amlodipine 5mg  daily.  Your goal blood pressure is <140/90. Work on low salt/sodium diet - goal <2 grams (2,000mg ) per day. Eat a diet high in fruits/vegetables and whole grains.  Look into mediterranean and DASH diet. Goal activity is 169min/wk of moderate intensity exercise.  This can be split into 30 minute chunks.  If you are not at this level, you can start with smaller 10-15 min increments and slowly build up activity. Look at www.heart.org for more resources  Return in 3-4 weeks for BP follow up visit.

## 2022-12-19 NOTE — Assessment & Plan Note (Addendum)
Markedly elevated blood pressures in office today.  Start amlodipine 5mg  daily. Rec start monitoring BP with home cuff he will buy.  DASH diet handout provided. Rec cut down on salt/sodium intake.  Rec cut down on caffeine intake. He has not recently used any decongestants.  RTC 1 mo HTN f/u visit

## 2022-12-19 NOTE — Progress Notes (Signed)
Ph: [phone] Fax: [phone]   Patient ID: Roger Evans, male    DOB: 1971/03/23, 52 y.o.   MRN: [id number]  This visit was conducted in person.  BP (!) 164/112 (BP Location: Right Arm, Cuff Size: Large)   Pulse 89   Temp 97.8 F (36.6 C) (Temporal)   Ht 5' 11.5" (1.816 m)   Wt 258 lb (117 kg)   SpO2 95%   BMI 35.48 kg/m   BP Readings from Last 3 Encounters:  12/19/22 (!) 164/112  09/25/22 138/86  03/24/20 138/86    CC: CPE Subjective:   HPI: Roger Evans is a 52 y.o. male presenting on 12/19/2022 for Annual Exam   Secondary hypogonadism presenting with R gynecomastia. Recurrent this year first R now bilateral over the past few weeks, last visit 08/2022 we referred back to endocrinology - didn't have appt until August 2024.   MRI brain w/w/o contrast 05/2019 IMPRESSION: 1. Appearance suspicious for pituitary microadenoma: two small areas of hypoenhancement are noted within the pituitary gland on delayed postcontrast images ranging from 2-4 mm. 2. Otherwise normal for age MRI appearance of the brain.  Elevated BP noted today. 4-5 cups coffee in am, none today. Both parents with hypertension at older age. No HA, vision changes, CP/tightness, SOB, leg swelling.   He's recently been taking pepcid PRN.   Preventative: Colon cancer screen - will refer for colonoscopy  Prostate cancer screening - no fmhx, normal stream, no significant nocturia. Discussed options.  Lung cancer screening -  Flu shot - yearly COVID shot - Moderna 09/2019, 10/2019, Pfizer 05/2022 Td 2010, Tdap 2020 Pneumonia shot - not due Shingrix - discussed. Has not had chicken pox.  Advanced directive discussion -  Seat belt use discussed Sunscreen use discussed. No changing moles on skin. Sees derm.  Sleep - averaging 6-7 hours/night Non smoker Alcohol - 1-2 beers/week Dentist - q6 mo Eye exam - yearly  4-5 cups coffee in am Married, 2 children  Occupation: IT at [employer] Activity: no regular exercise, some yard work Diet: good water, fruits/vegetables daily, limited red meat good poultry and fish     Relevant past medical, surgical, family and social history reviewed and updated as indicated. Interim medical history since our last visit reviewed. Allergies and medications reviewed and updated. Outpatient Medications Prior to Visit  Medication Sig Dispense Refill   fluticasone (FLONASE) 50 MCG/ACT nasal spray Place 2 sprays into both nostrils daily. (Patient taking differently: Place 2 sprays into both nostrils daily as needed.) 16 g 1   No facility-administered medications prior to visit.     Per HPI unless specifically indicated in ROS section below Review of Systems  Constitutional:  Negative for activity change, appetite change, chills, fatigue, fever and unexpected weight change.  HENT:  Negative for hearing loss.   Eyes:  Negative for visual disturbance.       No double vision  Respiratory:  Negative for cough, chest tightness, shortness of breath and wheezing.   Cardiovascular:  Negative for chest pain, palpitations and leg swelling.  Gastrointestinal:  Negative for abdominal distention, abdominal pain, blood in stool, constipation, diarrhea, nausea and vomiting.  Genitourinary:  Negative for difficulty urinating and hematuria.  Musculoskeletal:  Negative for arthralgias, myalgias and neck pain.  Skin:  Negative for rash.  Neurological:  Positive for dizziness (rare) and headaches (occ). Negative for seizures and syncope.  Hematological:  Negative for adenopathy. Does not bruise/bleed easily.  Psychiatric/Behavioral:  Negative for dysphoric  mood. The patient is not nervous/anxious.     Objective:  BP (!) 164/112 (BP Location: Right Arm, Cuff Size: Large)   Pulse 89   Temp 97.8 F (36.6 C) (Temporal)   Ht 5' 11.5" (1.816 m)   Wt 258 lb (117 kg)   SpO2 95%   BMI 35.48 kg/m   Wt Readings from Last 3 Encounters:  12/19/22 258 lb  (117 kg)  09/25/22 261 lb 2 oz (118.4 kg)  03/24/20 255 lb 2 oz (115.7 kg)      Physical Exam Vitals and nursing note reviewed.  Constitutional:      General: He is not in acute distress.    Appearance: Normal appearance. He is well-developed. He is not ill-appearing.  HENT:     Head: Normocephalic and atraumatic.     Right Ear: Hearing, tympanic membrane, ear canal and external ear normal.     Left Ear: Hearing, tympanic membrane, ear canal and external ear normal.     Mouth/Throat:     Mouth: Mucous membranes are moist.     Pharynx: Oropharynx is clear. No oropharyngeal exudate or posterior oropharyngeal erythema.  Eyes:     General: No scleral icterus.    Extraocular Movements: Extraocular movements intact.     Conjunctiva/sclera: Conjunctivae normal.     Pupils: Pupils are equal, round, and reactive to light.  Neck:     Thyroid: No thyroid mass or thyromegaly.  Cardiovascular:     Rate and Rhythm: Normal rate and regular rhythm.     Pulses: Normal pulses.          Radial pulses are 2+ on the right side and 2+ on the left side.     Heart sounds: Normal heart sounds. No murmur heard. Pulmonary:     Effort: Pulmonary effort is normal. No respiratory distress.     Breath sounds: Normal breath sounds. No wheezing, rhonchi or rales.  Abdominal:     General: Bowel sounds are normal. There is no distension.     Palpations: Abdomen is soft. There is no mass.     Tenderness: There is no abdominal tenderness. There is no guarding or rebound.     Hernia: No hernia is present.  Musculoskeletal:        General: Normal range of motion.     Cervical back: Normal range of motion and neck supple.     Right lower leg: No edema.     Left lower leg: No edema.  Lymphadenopathy:     Cervical: No cervical adenopathy.  Skin:    General: Skin is warm and dry.     Findings: No rash.  Neurological:     General: No focal deficit present.     Mental Status: He is alert and oriented to person,  place, and time.  Psychiatric:        Mood and Affect: Mood normal.        Behavior: Behavior normal.        Thought Content: Thought content normal.        Judgment: Judgment normal.       Results for orders placed or performed in visit on 12/12/22  Hepatitis C antibody  Result Value Ref Range   Hepatitis C Ab NON-REACTIVE NON-REACTIVE  PSA  Result Value Ref Range   PSA 0.64 0.10 - 4.00 ng/mL  Comprehensive metabolic panel  Result Value Ref Range   Sodium 138 135 - 145 mEq/L   Potassium 4.3 3.5 - 5.1 mEq/L  Chloride 103 96 - 112 mEq/L   CO2 26 19 - 32 mEq/L   Glucose, Bld 88 70 - 99 mg/dL   BUN 17 6 - 23 mg/dL   Creatinine, Ser 9.56 0.40 - 1.50 mg/dL   Total Bilirubin 0.6 0.2 - 1.2 mg/dL   Alkaline Phosphatase 68 39 - 117 U/L   AST 27 0 - 37 U/L   ALT 45 0 - 53 U/L   Total Protein 7.3 6.0 - 8.3 g/dL   Albumin 4.4 3.5 - 5.2 g/dL   GFR 21.30 >86.57 mL/min   Calcium 9.5 8.4 - 10.5 mg/dL  Lipid panel  Result Value Ref Range   Cholesterol 198 0 - 200 mg/dL   Triglycerides 846.9 0.0 - 149.0 mg/dL   HDL 62.95 >28.41 mg/dL   VLDL 32.4 0.0 - 40.1 mg/dL   LDL Cholesterol 027 (H) 0 - 99 mg/dL   Total CHOL/HDL Ratio 3    NonHDL 139.46     Assessment & Plan:   Problem List Items Addressed This Visit     Encounter for general adult medical examination with abnormal findings - Primary (Chronic)    Preventative protocols reviewed and updated unless pt declined. Discussed healthy diet and lifestyle.  Requests colonoscopy referral.       Severe obesity (BMI 35.0-39.9) with comorbidity (HCC)    Encouraged healthy diet and lifestyle choices to affect sustainable weight loss.       Gynecomastia, male    First R sided now bilateral, tender. Anticipate secondary hypogonadism contributing as per below.       Relevant Orders   Ambulatory referral to Endocrinology   Secondary male hypogonadism    Pending re establishing with endocrinologist.  Has appt with Dr Sharl Ma 03/2023 -  will see if [facility] can see him any sooner.       Relevant Orders   Ambulatory referral to Endocrinology   Pituitary microadenoma [facility])    H/o this previously saw [facility] Dr Everardo All.  Pending re-establishing with endo.       Relevant Orders   Ambulatory referral to Endocrinology   Hypertension    Markedly elevated blood pressures in office today.  Start amlodipine 5mg  daily. Rec start monitoring BP with home cuff he will buy.  DASH diet handout provided. Rec cut down on salt/sodium intake.  Rec cut down on caffeine intake. He has not recently used any decongestants.  RTC 1 mo HTN f/u visit       Relevant Medications   amLODipine (NORVASC) 5 MG tablet   Other Visit Diagnoses     Special screening for malignant neoplasms, colon       Relevant Orders   Ambulatory referral to Gastroenterology        Meds ordered this encounter  Medications   famotidine (PEPCID) 20 MG tablet    Sig: Take 1 tablet (20 mg total) by mouth daily as needed for heartburn or indigestion.   amLODipine (NORVASC) 5 MG tablet    Sig: Take 1 tablet (5 mg total) by mouth daily.    Dispense:  30 tablet    Refill:  3    Orders Placed This Encounter  Procedures   Ambulatory referral to Gastroenterology    Referral Priority:   Routine    Referral Type:   Consultation    Referral Reason:   Specialty Services Required    Number of Visits Requested:   1   Ambulatory referral to Endocrinology    Referral Priority:  Routine    Referral Type:   Consultation    Referral Reason:   Specialty Services Required    Number of Visits Requested:   1    Patient Instructions  We will see if [facility]inology has any sooner appointments.  Will refer to [facility] to schedule colonoscopy - you may call at [phone] to schedule appointment.   Consider shingrix vaccine series.  BP was too high - buy home BP cuff. Start amlodipine 5mg  daily.  Your goal blood pressure is <140/90. Work on low salt/sodium diet  - goal <2 grams (2,000mg ) per day. Eat a diet high in fruits/vegetables and whole grains.  Look into mediterranean and DASH diet. Goal activity is 158min/wk of moderate intensity exercise.  This can be split into 30 minute chunks.  If you are not at this level, you can start with smaller 10-15 min increments and slowly build up activity. Look at www.heart.org for more resources  Return in 3-4 weeks for BP follow up visit.   Follow up plan: Return in about 1 year (around 12/19/2023), or if symptoms worsen or fail to improve, for annual exam, prior fasting for blood work.  Eustaquio Boyden, MD

## 2022-12-19 NOTE — Assessment & Plan Note (Signed)
Encouraged healthy diet and lifestyle choices to affect sustainable weight loss.  ?

## 2022-12-19 NOTE — Assessment & Plan Note (Signed)
Pending re establishing with endocrinologist.  Has appt with Dr Sharl Ma 03/2023 - will see if [facility] can see him any sooner.

## 2022-12-30 DIAGNOSIS — R7309 Other abnormal glucose: Secondary | ICD-10-CM | POA: Diagnosis not present

## 2023-01-05 ENCOUNTER — Encounter: Payer: Self-pay | Admitting: Gastroenterology

## 2023-01-12 ENCOUNTER — Ambulatory Visit (AMBULATORY_SURGERY_CENTER): Payer: BC Managed Care – PPO

## 2023-01-12 ENCOUNTER — Telehealth: Payer: Self-pay

## 2023-01-12 ENCOUNTER — Encounter: Payer: Self-pay | Admitting: Gastroenterology

## 2023-01-12 VITALS — Ht 71.5 in | Wt 254.0 lb

## 2023-01-12 DIAGNOSIS — Z1211 Encounter for screening for malignant neoplasm of colon: Secondary | ICD-10-CM

## 2023-01-12 MED ORDER — NA SULFATE-K SULFATE-MG SULF 17.5-3.13-1.6 GM/177ML PO SOLN: 1.0000 | Freq: Once | ORAL | 0 refills | Status: AC

## 2023-01-12 NOTE — Progress Notes (Signed)
No egg or soy allergy known to patient  No issues known to pt with past sedation with any surgeries or procedures Patient denies ever being told they had issues or difficulty with intubation  No FH of Malignant Hyperthermia Pt is not on diet pills Pt is not on  home 02  Pt is not on blood thinners  Pt denies issues with constipation  No A fib or A flutter Have any cardiac testing pending-- no  LOA: independent  Patient's chart reviewed by Cathlyn Parsons CNRA prior to previsit and patient appropriate for the [facility].  Previsit completed and red dot placed by patient's name on their procedure day (on provider's schedule).     PV completed. Prep reviewed. Instructions sent via mychart and to home address. Good rx coupon for [employer] provided. Pt instructed to use Singlecare.com or GoodRx for a price reduction on prep if needed.

## 2023-01-15 NOTE — Telephone Encounter (Signed)
completed

## 2023-01-29 DIAGNOSIS — R7309 Other abnormal glucose: Secondary | ICD-10-CM | POA: Diagnosis not present

## 2023-01-29 HISTORY — PX: COLONOSCOPY: SHX174

## 2023-01-30 ENCOUNTER — Ambulatory Visit: Payer: BC Managed Care – PPO | Admitting: Gastroenterology

## 2023-01-30 ENCOUNTER — Encounter: Payer: Self-pay | Admitting: Gastroenterology

## 2023-01-30 VITALS — BP 116/68 | HR 76 | Temp 97.8°F | Resp 11 | Ht 71.5 in | Wt 245.0 lb

## 2023-01-30 DIAGNOSIS — Z1211 Encounter for screening for malignant neoplasm of colon: Secondary | ICD-10-CM

## 2023-01-30 MED ORDER — SODIUM CHLORIDE 0.9 % IV SOLN: 500.0000 mL | Freq: Once | INTRAVENOUS | Status: DC

## 2023-01-30 NOTE — Progress Notes (Signed)
Uneventful anesthetic. Report to pacu rn. Vss. Care resumed by rn. 

## 2023-01-30 NOTE — Op Note (Signed)
[facility] Patient Name: Roger Evans Procedure Date: 01/30/2023 10:43 AM MRN: [id number] Endoscopist: Lynann Bologna , MD, [id number] Age: 52 Referring MD:  Date of Birth: 1971/02/18 Gender: Male Account #: [id number] Procedure:                Colonoscopy Indications:              Screening for colorectal malignant neoplasm Medicines:                Monitored Anesthesia Care Procedure:                Pre-Anesthesia Assessment:                           - Prior to the procedure, a History and Physical                            was performed, and patient medications and                            allergies were reviewed. The patient's tolerance of                            previous anesthesia was also reviewed. The risks                            and benefits of the procedure and the sedation                            options and risks were discussed with the patient.                            All questions were answered, and informed consent                            was obtained. Prior Anticoagulants: The patient has                            taken no anticoagulant or antiplatelet agents. ASA                            Grade Assessment: II - A patient with mild systemic                            disease. After reviewing the risks and benefits,                            the patient was deemed in satisfactory condition to                            undergo the procedure.                           After obtaining informed consent, the colonoscope  was passed under direct vision. Throughout the                            procedure, the patient's blood pressure, pulse, and                            oxygen saturations were monitored continuously. The                            Olympus Scope SN: [id number] was introduced through                            the anus and advanced to the 2 cm into the ileum.                            The colonoscopy was  performed without difficulty.                            The patient tolerated the procedure well. The                            quality of the bowel preparation was good. The                            terminal ileum, ileocecal valve, appendiceal                            orifice, and rectum were photographed. Scope In: 10:50:12 AM Scope Out: 11:02:05 AM Scope Withdrawal Time: 0 hours 8 minutes 54 seconds  Total Procedure Duration: 0 hours 11 minutes 53 seconds  Findings:                 Multiple medium-mouthed diverticula were found in                            the sigmoid colon, few in descending colon and                            transverse colon.                           Non-bleeding internal hemorrhoids were found during                            retroflexion. The hemorrhoids were Grade I                            (internal hemorrhoids that do not prolapse).                           The terminal ileum appeared normal.                           The exam was otherwise without abnormality on  direct and retroflexion views. Complications:            No immediate complications. Estimated Blood Loss:     Estimated blood loss: none. Impression:               - Moderate predominantly sigmoid diverticulosis.                           - Non-bleeding internal hemorrhoids.                           - The examined portion of the ileum was normal.                           - The examination was otherwise normal on direct                            and retroflexion views.                           - No specimens collected. Recommendation:           - Patient has a contact number available for                            emergencies. The signs and symptoms of potential                            delayed complications were discussed with the                            patient. Return to normal activities tomorrow.                            Written discharge  instructions were provided to the                            patient.                           - High fiber diet.                           - Continue present medications.                           - Repeat colonoscopy in 10 years for screening                            purposes. Earlier, if with any new problems or                            change in family history.                           - The findings and recommendations were discussed  with the patient's family. Lynann Bologna, MD 01/30/2023 11:07:22 AM This report has been signed electronically.

## 2023-01-30 NOTE — Progress Notes (Signed)
[facility] History and Physical   Primary Care Physician:  Eustaquio Boyden, MD   Reason for Procedure:     CRC screening  Plan:     colonoscopy     HPI: Roger Evans is a 52 y.o. male    Past Medical History:  Diagnosis Date   Hypertension    Radiculitis of left cervical region 07/31/2012   saw PT, Zachery Dauer - L arm weakness consistent with C6-7 cervical radiculitis confirmed by MRI and EMG (disc pressing on L C7 nerve root) offered neurosurgeon vs PM&R referral    Past Surgical History:  Procedure Laterality Date   VASECTOMY  07/28/2005    Prior to Admission medications   Medication Sig Start Date End Date Taking? Authorizing Provider  amLODipine (NORVASC) 5 MG tablet Take 1 tablet (5 mg total) by mouth daily. 12/19/22  Yes Eustaquio Boyden, MD  famotidine (PEPCID) 20 MG tablet Take 1 tablet (20 mg total) by mouth daily as needed for heartburn or indigestion. 12/19/22  Yes Eustaquio Boyden, MD  fluticasone [facility]) 50 MCG/ACT nasal spray Place 2 sprays into both nostrils daily. Patient taking differently: Place 2 sprays into both nostrils daily as needed. 07/04/18  Yes Eustaquio Boyden, MD    Current Outpatient Medications  Medication Sig Dispense Refill   amLODipine (NORVASC) 5 MG tablet Take 1 tablet (5 mg total) by mouth daily. 30 tablet 3   famotidine (PEPCID) 20 MG tablet Take 1 tablet (20 mg total) by mouth daily as needed for heartburn or indigestion.     fluticasone (FLONASE) 50 MCG/ACT nasal spray Place 2 sprays into both nostrils daily. (Patient taking differently: Place 2 sprays into both nostrils daily as needed.) 16 g 1   No current facility-administered medications for this visit.    Allergies as of 01/30/2023   (No Known Allergies)    Family History  Problem Relation Age of Onset   Diabetes Mother    Other Neg Hx        low testosterone   Breast cancer Neg Hx    Colon cancer Neg Hx    Rectal cancer Neg Hx    Stomach cancer Neg  Hx    Esophageal cancer Neg Hx     Social History   Socioeconomic History   Marital status: Married    Spouse name: Not on file   Number of children: Not on file   Years of education: Not on file   Highest education level: Not on file  Occupational History   Not on file  Tobacco Use   Smoking status: Never   Smokeless tobacco: Never  Vaping Use   Vaping Use: Never used  Substance and Sexual Activity   Alcohol use: Yes    Comment: 2 beer a week   Drug use: No   Sexual activity: Not on file  Other Topics Concern   Not on file  Social History Narrative   Married, 2 children,    Occupation: IT at [employer]   Activity: [employer]   Social Determinants of Corporate investment banker Strain: Not on file  Food Insecurity: Not on file  Transportation Needs: Not on file  Physical Activity: Not on file  Stress: Not on file  Social Connections: Not on file  Intimate Partner Violence: Not on file    Review of Systems: Positive for none All other review of systems negative except as mentioned in the HPI.  Physical Exam: Vital signs in last 24 hours: @VSRANGES @  General:   Alert,  Well-developed, well-nourished, pleasant and cooperative in NAD Lungs:  Clear throughout to auscultation.   Heart:  Regular rate and rhythm; no murmurs, clicks, rubs,  or gallops. Abdomen:  Soft, nontender and nondistended. Normal bowel sounds.   Neuro/Psych:  Alert and cooperative. Normal mood and affect. A and O x 3    No significant changes were identified.  The patient continues to be an appropriate candidate for the planned procedure and anesthesia.   Edman Circle, MD. [facility] 01/30/2023 11:45 AM@

## 2023-01-30 NOTE — Progress Notes (Signed)
Pt's states no medical or surgical changes since previsit or office visit. 

## 2023-01-30 NOTE — Patient Instructions (Signed)
Thank you for letting us take care of your healthcare needs today. Please see handouts given  to you on Diverticulosis, High Fiber Diet and Hemorrhoids.    YOU HAD AN ENDOSCOPIC PROCEDURE TODAY AT THE [facility]:   Refer to the procedure report that was given to you for any specific questions about what was found during the examination.  If the procedure report does not answer your questions, please call your gastroenterologist to clarify.  If you requested that your care partner not be given the details of your procedure findings, then the procedure report has been included in a sealed envelope for you to review at your convenience later.  YOU SHOULD EXPECT: Some feelings of bloating in the abdomen. Passage of more gas than usual.  Walking can help get rid of the air that was put into your GI tract during the procedure and reduce the bloating. If you had a lower endoscopy (such as a colonoscopy or flexible sigmoidoscopy) you may notice spotting of blood in your stool or on the toilet paper. If you underwent a bowel prep for your procedure, you may not have a normal bowel movement for a few days.  Please Note:  You might notice some irritation and congestion in your nose or some drainage.  This is from the oxygen used during your procedure.  There is no need for concern and it should clear up in a day or so.  SYMPTOMS TO REPORT IMMEDIATELY:  Following lower endoscopy (colonoscopy or flexible sigmoidoscopy):  Excessive amounts of blood in the stool  Significant tenderness or worsening of abdominal pains  Swelling of the abdomen that is new, acute  Fever of 100F or higher   For urgent or emergent issues, a gastroenterologist can be reached at any hour by calling [phone]. Do not use MyChart messaging for urgent concerns.    DIET:  We do recommend a small meal at first, but then you may proceed to your regular diet.  Drink plenty of fluids but you should avoid alcoholic  beverages for 24 hours.  ACTIVITY:  You should plan to take it easy for the rest of today and you should NOT DRIVE or use heavy machinery until tomorrow (because of the sedation medicines used during the test).    FOLLOW UP: Our staff will call the number listed on your records the next business day following your procedure.  We will call around 7:15- 8:00 am to check on you and address any questions or concerns that you may have regarding the information given to you following your procedure. If we do not reach you, we will leave a message.     If any biopsies were taken you will be contacted by phone or by letter within the next 1-3 weeks.  Please call us at [phone] if you have not heard about the biopsies in 3 weeks.    SIGNATURES/CONFIDENTIALITY: You and/or your care partner have signed paperwork which will be entered into your electronic medical record.  These signatures attest to the fact that that the information above on your After Visit Summary has been reviewed and is understood.  Full responsibility of the confidentiality of this discharge information lies with you and/or your care-partner. 

## 2023-01-31 ENCOUNTER — Telehealth: Payer: Self-pay

## 2023-01-31 NOTE — Telephone Encounter (Signed)
Post procedure follow up call, no answer 

## 2023-03-01 DIAGNOSIS — R7309 Other abnormal glucose: Secondary | ICD-10-CM | POA: Diagnosis not present

## 2023-03-16 ENCOUNTER — Telehealth: Payer: Self-pay

## 2023-03-16 DIAGNOSIS — I10 Essential (primary) hypertension: Secondary | ICD-10-CM

## 2023-03-16 DIAGNOSIS — D352 Benign neoplasm of pituitary gland: Secondary | ICD-10-CM | POA: Diagnosis not present

## 2023-03-16 DIAGNOSIS — E291 Testicular hypofunction: Secondary | ICD-10-CM | POA: Diagnosis not present

## 2023-03-16 MED ORDER — AMLODIPINE BESYLATE 5 MG PO TABS: 5.0000 mg | ORAL_TABLET | Freq: Every day | ORAL | 3 refills | Status: DC

## 2023-03-16 NOTE — Telephone Encounter (Signed)
E-scribed refill.  Per CPE OV notes (12/19/22), pt was to follow up in 1 mo for HTN. Plz schedule f/u for additional refills.

## 2023-03-19 NOTE — Telephone Encounter (Signed)
Lvmtcb, sent mychart message  

## 2023-03-29 ENCOUNTER — Other Ambulatory Visit: Payer: Self-pay | Admitting: Internal Medicine

## 2023-03-29 DIAGNOSIS — E291 Testicular hypofunction: Secondary | ICD-10-CM

## 2023-03-29 DIAGNOSIS — D352 Benign neoplasm of pituitary gland: Secondary | ICD-10-CM

## 2023-04-01 DIAGNOSIS — R7309 Other abnormal glucose: Secondary | ICD-10-CM | POA: Diagnosis not present

## 2023-04-24 ENCOUNTER — Ambulatory Visit
Admission: RE | Admit: 2023-04-24 | Discharge: 2023-04-24 | Disposition: A | Discharge status: Home / Self Care | Payer: BC Managed Care – PPO | Source: Ambulatory Visit | Attending: Internal Medicine | Admitting: Internal Medicine

## 2023-04-24 DIAGNOSIS — D352 Benign neoplasm of pituitary gland: Secondary | ICD-10-CM

## 2023-04-24 DIAGNOSIS — E291 Testicular hypofunction: Secondary | ICD-10-CM

## 2023-04-24 MED ORDER — GADOPICLENOL 0.5 MMOL/ML IV SOLN
10.0000 mL | Freq: Once | INTRAVENOUS | Status: AC | PRN
  Administered 2023-04-24: 10 mL via INTRAVENOUS

## 2023-05-01 DIAGNOSIS — R7309 Other abnormal glucose: Secondary | ICD-10-CM | POA: Diagnosis not present

## 2023-05-01 DIAGNOSIS — Z23 Encounter for immunization: Secondary | ICD-10-CM | POA: Diagnosis not present

## 2023-05-03 DIAGNOSIS — D2272 Melanocytic nevi of left lower limb, including hip: Secondary | ICD-10-CM | POA: Diagnosis not present

## 2023-05-03 DIAGNOSIS — D2271 Melanocytic nevi of right lower limb, including hip: Secondary | ICD-10-CM | POA: Diagnosis not present

## 2023-05-03 DIAGNOSIS — D2262 Melanocytic nevi of left upper limb, including shoulder: Secondary | ICD-10-CM | POA: Diagnosis not present

## 2023-05-03 DIAGNOSIS — D2261 Melanocytic nevi of right upper limb, including shoulder: Secondary | ICD-10-CM | POA: Diagnosis not present

## 2023-05-03 DIAGNOSIS — L570 Actinic keratosis: Secondary | ICD-10-CM | POA: Diagnosis not present

## 2023-06-01 DIAGNOSIS — R7309 Other abnormal glucose: Secondary | ICD-10-CM | POA: Diagnosis not present

## 2023-07-01 DIAGNOSIS — R7309 Other abnormal glucose: Secondary | ICD-10-CM | POA: Diagnosis not present

## 2023-07-11 ENCOUNTER — Other Ambulatory Visit: Payer: Self-pay | Admitting: Family Medicine

## 2023-07-11 DIAGNOSIS — I10 Essential (primary) hypertension: Secondary | ICD-10-CM

## 2023-07-11 NOTE — Telephone Encounter (Signed)
Per 12/19/22 OV, pt was to return for 1 mo HTN f/u.   Plz schedule f/u OV above. Then return request to [facility] for refill.

## 2023-07-11 NOTE — Telephone Encounter (Signed)
Spoke to pt, scheduled f/u for 07/27/23

## 2023-07-11 NOTE — Telephone Encounter (Signed)
Lvm for patient tcb and schedule 

## 2023-07-11 NOTE — Telephone Encounter (Signed)
Noted.  E-scribed refill. 

## 2023-07-17 DIAGNOSIS — Z125 Encounter for screening for malignant neoplasm of prostate: Secondary | ICD-10-CM | POA: Diagnosis not present

## 2023-07-17 DIAGNOSIS — D352 Benign neoplasm of pituitary gland: Secondary | ICD-10-CM | POA: Diagnosis not present

## 2023-07-17 DIAGNOSIS — E291 Testicular hypofunction: Secondary | ICD-10-CM | POA: Diagnosis not present

## 2023-07-17 DIAGNOSIS — N62 Hypertrophy of breast: Secondary | ICD-10-CM | POA: Diagnosis not present

## 2023-07-27 ENCOUNTER — Encounter: Payer: Self-pay | Admitting: Family Medicine

## 2023-07-27 ENCOUNTER — Ambulatory Visit: Payer: BC Managed Care – PPO | Admitting: Family Medicine

## 2023-07-27 VITALS — BP 121/78 | HR 90 | Temp 98.2°F | Ht 71.5 in | Wt 263.5 lb

## 2023-07-27 DIAGNOSIS — I10 Essential (primary) hypertension: Secondary | ICD-10-CM | POA: Diagnosis not present

## 2023-07-27 DIAGNOSIS — D352 Benign neoplasm of pituitary gland: Secondary | ICD-10-CM | POA: Diagnosis not present

## 2023-07-27 DIAGNOSIS — E291 Testicular hypofunction: Secondary | ICD-10-CM

## 2023-07-27 DIAGNOSIS — R2231 Localized swelling, mass and lump, right upper limb: Secondary | ICD-10-CM | POA: Insufficient documentation

## 2023-07-27 MED ORDER — AMLODIPINE BESYLATE 5 MG PO TABS
5.0000 mg | ORAL_TABLET | Freq: Every day | ORAL | 2 refills | Status: DC
Start: 2023-07-27 — End: 2023-12-21

## 2023-07-27 NOTE — Assessment & Plan Note (Signed)
Discussed weight gain noted. Encouraged healthy diet and lifestyle choices for sustainable weight loss.  He is planning to start regimented exercise program.

## 2023-07-27 NOTE — Assessment & Plan Note (Signed)
Great control based on home readings. Anticipate component of white coat hypertension - discussed this.  Continue amlodipine 5mg  daily. RTC 5 mo CPE.

## 2023-07-27 NOTE — Progress Notes (Signed)
Ph: [phone] Fax: [phone]   Patient ID: Roger Evans, male    DOB: 1970-12-09, 52 y.o.   MRN: [id number]  This visit was conducted in person.  BP 121/78 Comment: with home BP cuff this morning  Pulse 90   Temp 98.2 F (36.8 C) (Oral)   Ht 5' 11.5" (1.816 m)   Wt 263 lb 8 oz (119.5 kg)   SpO2 97%   BMI 36.24 kg/m   BP Readings from Last 3 Encounters:  07/27/23 121/78  01/30/23 116/68  12/19/22 (!) 164/112   CC: 6 mo f/u visit  Subjective:   HPI: Roger Evans is a 52 y.o. male presenting on 07/27/2023 for Medical Management of Chronic Issues (Here for HTN f/u.)   18 lb weight gain noted.   Pituitary microadenoma found by brain MRI in setting of hypogonadism and gynecomastia - established with [facility] endocrinology Dr Roanna Raider. On Clomid for the past 3 years. Reviewed recent endo labs - PSA and CBC was normal.   Intermittent sinus headaches managed with PRN decongestant.   HTN - continues amlodipine 5mg  daily, tolerating well. Does check blood pressures at home and overall well controlled - this morning BP 121/78, yesterday 113/82. 110-130/70-80, pulse 70-80s. ?component of white coat hypertension. No low blood pressure readings or symptoms of dizziness/syncope.  Denies HA, vision changes, CP/tightness, SOB, leg swelling.   Colonoscopy 01/2023 - mod diverticulosis, int hem rpt 10 yrs Chales Abrahams).   Notes nodule to R palm at 4th [facility]. Tender with pressure, not activity limiting, no limitation to ROM, no finger triggering     Relevant past medical, surgical, family and social history reviewed and updated as indicated. Interim medical history since our last visit reviewed. Allergies and medications reviewed and updated. Outpatient Medications Prior to Visit  Medication Sig Dispense Refill   CLOMID 50 MG tablet 1 tablet by mouth every other day for 30 days     famotidine (PEPCID) 20 MG tablet Take 1 tablet (20 mg total) by mouth daily as needed for heartburn or  indigestion.     fluticasone (FLONASE) 50 MCG/ACT nasal spray Place 2 sprays into both nostrils daily. (Patient taking differently: Place 2 sprays into both nostrils daily as needed.) 16 g 1   amLODipine (NORVASC) 5 MG tablet TAKE 1 TABLET BY MOUTH DAILY 30 tablet 0   No facility-administered medications prior to visit.     Per HPI unless specifically indicated in ROS section below Review of Systems  Objective:  BP 121/78 Comment: with home BP cuff this morning  Pulse 90   Temp 98.2 F (36.8 C) (Oral)   Ht 5' 11.5" (1.816 m)   Wt 263 lb 8 oz (119.5 kg)   SpO2 97%   BMI 36.24 kg/m   Wt Readings from Last 3 Encounters:  07/27/23 263 lb 8 oz (119.5 kg)  01/30/23 245 lb (111.1 kg)  01/12/23 254 lb (115.2 kg)      Physical Exam Vitals and nursing note reviewed.  Constitutional:      Appearance: Normal appearance. He is not ill-appearing.  HENT:     Mouth/Throat:     Mouth: Mucous membranes are moist.     Pharynx: Oropharynx is clear. No oropharyngeal exudate or posterior oropharyngeal erythema.  Eyes:     Extraocular Movements: Extraocular movements intact.     Conjunctiva/sclera: Conjunctivae normal.     Pupils: Pupils are equal, round, and reactive to light.  Cardiovascular:     Rate and Rhythm: Normal  rate and regular rhythm.     Pulses: Normal pulses.     Heart sounds: Normal heart sounds. No murmur heard. Pulmonary:     Effort: Pulmonary effort is normal. No respiratory distress.     Breath sounds: Normal breath sounds. No wheezing, rhonchi or rales.  Musculoskeletal:     Right lower leg: No edema.     Left lower leg: No edema.     Comments:  Nodule to R 4th palm along mid metacarpal, tender to palpation  FROM of hand, no triggering   Skin:    General: Skin is warm and dry.     Findings: No rash.  Neurological:     Mental Status: He is alert.  Psychiatric:        Mood and Affect: Mood normal.        Behavior: Behavior normal.        Assessment & Plan:    Problem List Items Addressed This Visit     Severe obesity (BMI 35.0-39.9) with comorbidity (HCC)   Discussed weight gain noted. Encouraged healthy diet and lifestyle choices for sustainable weight loss.  He is planning to start regimented exercise program.       Secondary male hypogonadism   Appreciate endo care. Has established with [facility] endocrinology.       Pituitary microadenoma (HCC)   Appreciate endo care.       White coat syndrome with hypertension - Primary   Great control based on home readings. Anticipate component of white coat hypertension - discussed this.  Continue amlodipine 5mg  daily. RTC 5 mo CPE.       Relevant Medications   amLODipine (NORVASC) 5 MG tablet   Subcutaneous nodule of right hand   Suspect developing tendon nodule, not bothersome at this time. Will continue to monitor.         Meds ordered this encounter  Medications   amLODipine (NORVASC) 5 MG tablet    Sig: Take 1 tablet (5 mg total) by mouth daily.    Dispense:  90 tablet    Refill:  2    No orders of the defined types were placed in this encounter.   Patient Instructions  Likely component of white coat hypertension. Continue monitoring BP at home , let me know if consistently >140/90.  Continue amlodipine 5mg  daily. Return in 5 months for physical   Follow up plan: Return in about 5 months (around 12/25/2023) for annual exam, prior fasting for blood work.  Eustaquio Boyden, MD

## 2023-07-27 NOTE — Patient Instructions (Signed)
Likely component of white coat hypertension. Continue monitoring BP at home , let me know if consistently >140/90.  Continue amlodipine 5mg  daily. Return in 5 months for physical

## 2023-07-27 NOTE — Assessment & Plan Note (Signed)
Appreciate endo care. Has established with [facility] endocrinology.

## 2023-07-27 NOTE — Assessment & Plan Note (Signed)
Appreciate endo care.  

## 2023-07-27 NOTE — Assessment & Plan Note (Signed)
Suspect developing tendon nodule, not bothersome at this time. Will continue to monitor.

## 2023-08-01 DIAGNOSIS — R7309 Other abnormal glucose: Secondary | ICD-10-CM | POA: Diagnosis not present

## 2023-08-10 ENCOUNTER — Ambulatory Visit: Payer: BC Managed Care – PPO | Admitting: Family Medicine

## 2023-08-10 ENCOUNTER — Encounter: Payer: Self-pay | Admitting: Family Medicine

## 2023-08-10 VITALS — BP 144/86 | HR 84 | Temp 98.0°F | Ht 71.5 in | Wt 260.1 lb

## 2023-08-10 DIAGNOSIS — R1032 Left lower quadrant pain: Secondary | ICD-10-CM | POA: Insufficient documentation

## 2023-08-10 LAB — POC URINALSYSI DIPSTICK (AUTOMATED)
Blood, UA: NEGATIVE
Glucose, UA: NEGATIVE
Ketones, UA: NEGATIVE
Leukocytes, UA: NEGATIVE
Nitrite, UA: NEGATIVE
Protein, UA: NEGATIVE
Spec Grav, UA: >=1.03 — AB (ref 1.010–1.025)
Urobilinogen, UA: 0.2 U/dL
pH, UA: 5 (ref 5.0–8.0)

## 2023-08-10 NOTE — Assessment & Plan Note (Addendum)
 Anticipate groin strain. Not consistent with diverticulitis, no obvious inguinal hernias appreciated. Supportive measures reviewed - heating pad, gentle stretching, rest.  Update if not improving as expected.  Declines muscle relaxant. Reviewed signs to suggest hernia or diverticulitis - none at this time.

## 2023-08-10 NOTE — Progress Notes (Signed)
 Ph: [phone] Fax: [phone]   Patient ID: Roger Evans, male    DOB: July 21, 1971, 53 y.o.   MRN: [id number]  This visit was conducted in person.  BP (!) 144/86   Pulse 84   Temp 98 F (36.7 C) (Oral)   Ht 5' 11.5 (1.816 m)   Wt 260 lb 2 oz (118 kg)   SpO2 93%   BMI 35.77 kg/m    CC: abdominal pain  Subjective:   HPI: Roger Evans is a 53 y.o. male presenting on 08/10/2023 for Abdominal Pain (C/o LLQ abd pain. Denies N/V/D. Started 1 wk ago. )   He's started workout routine through class at gym.  1 wk h/o lower left sided abdominal pain described as soreness/ache. Possible mild constipation.   No fevers/chills, nausea/vomiting, diarrhea or blood in stool. Hasn't noted bulge to groin.   Known white coat hypertension. Home readings have been low - 99-110s/70s. Discussed cutting BP med in half.   COLONOSCOPY 01/2023 - Mod diverticulosis, int hem rpt 10 yrs [facility]) Continues clomid  Q3 days.      Relevant past medical, surgical, family and social history reviewed and updated as indicated. Interim medical history since our last visit reviewed. Allergies and medications reviewed and updated. Outpatient Medications Prior to Visit  Medication Sig Dispense Refill   amLODipine  (NORVASC ) 5 MG tablet Take 1 tablet (5 mg total) by mouth daily. 90 tablet 2   famotidine  (PEPCID ) 20 MG tablet Take 1 tablet (20 mg total) by mouth daily as needed for heartburn or indigestion.     fluticasone  (FLONASE ) 50 MCG/ACT nasal spray Place 2 sprays into both nostrils daily. (Patient taking differently: Place 2 sprays into both nostrils daily as needed.) 16 g 1   CLOMID  50 MG tablet 1 tablet by mouth every other day for 30 days     CLOMID  50 MG tablet Take 1 tablet (50 mg total) by mouth every 3 (three) days.     No facility-administered medications prior to visit.     Per HPI unless specifically indicated in ROS section below Review of Systems  Objective:  BP (!) 144/86   Pulse  84   Temp 98 F (36.7 C) (Oral)   Ht 5' 11.5 (1.816 m)   Wt 260 lb 2 oz (118 kg)   SpO2 93%   BMI 35.77 kg/m   Wt Readings from Last 3 Encounters:  08/10/23 260 lb 2 oz (118 kg)  07/27/23 263 lb 8 oz (119.5 kg)  01/30/23 245 lb (111.1 kg)      Physical Exam Vitals and nursing note reviewed.  Constitutional:      Appearance: Normal appearance. He is not ill-appearing.  HENT:     Mouth/Throat:     Mouth: Mucous membranes are moist.     Pharynx: Oropharynx is clear. No oropharyngeal exudate or posterior oropharyngeal erythema.  Eyes:     Extraocular Movements: Extraocular movements intact.     Pupils: Pupils are equal, round, and reactive to light.  Cardiovascular:     Rate and Rhythm: Normal rate and regular rhythm.     Pulses: Normal pulses.     Heart sounds: Normal heart sounds. No murmur heard. Pulmonary:     Effort: Pulmonary effort is normal. No respiratory distress.     Breath sounds: Normal breath sounds. No wheezing, rhonchi or rales.  Abdominal:     General: Bowel sounds are normal. There is no distension.     Palpations: Abdomen is soft.  There is no mass.     Tenderness: There is no abdominal tenderness. There is no right CVA tenderness, left CVA tenderness, guarding or rebound. Negative signs include Murphy's sign.     Hernia: No hernia is present.       Comments:  No appreciated hernias  No significant tenderness to palpation of abdomen No rebound or guarding   Musculoskeletal:     Cervical back: Normal range of motion.     Right lower leg: No edema.     Left lower leg: No edema.     Comments:  Reproducible tenderness when transitioning from supine to sitting  Reproducible tenderness with hip flexion against resistance  Skin:    General: Skin is warm and dry.     Findings: No rash.  Neurological:     Mental Status: He is alert.  Psychiatric:        Mood and Affect: Mood normal.        Behavior: Behavior normal.       Results for orders placed or  performed in visit on 08/10/23  POCT Urinalysis Dipstick (Automated)   Collection Time: 08/10/23  8:50 AM  Result Value Ref Range   Color, UA yellow    Clarity, UA clear    Glucose, UA Negative Negative   Bilirubin, UA 1+    Ketones, UA negative    Spec Grav, UA >=1.030 (A) 1.010 - 1.025   Blood, UA negative    pH, UA 5.0 5.0 - 8.0   Protein, UA Negative Negative   Urobilinogen, UA 0.2 0.2 or 1.0 E.U./dL   Nitrite, UA negative    Leukocytes, UA Negative Negative    Assessment & Plan:   Problem List Items Addressed This Visit     Discomfort of left groin - Primary   Anticipate groin strain. Not consistent with diverticulitis, no obvious inguinal hernias appreciated. Supportive measures reviewed - heating pad, gentle stretching, rest.  Update if not improving as expected.  Declines muscle relaxant. Reviewed signs to suggest hernia or diverticulitis - none at this time.       Other Visit Diagnoses       Left lower quadrant abdominal pain       Relevant Orders   POCT Urinalysis Dipstick (Automated) (Completed)        No orders of the defined types were placed in this encounter.   Orders Placed This Encounter  Procedures   POCT Urinalysis Dipstick (Automated)    Patient Instructions  Likely muscle /groin strain.  Anticipate it should get better with time. Rest area.  Could try heating pad to area.  No obvious hernia today, not consistent with diverticulitis.  Let us  know if not improving.   Follow up plan: Return if symptoms worsen or fail to improve.  Anton Blas, MD

## 2023-08-10 NOTE — Patient Instructions (Signed)
 Likely muscle /groin strain.  Anticipate it should get better with time. Rest area.  Could try heating pad to area.  No obvious hernia today, not consistent with diverticulitis.  Let us know if not improving.

## 2023-09-01 DIAGNOSIS — R7309 Other abnormal glucose: Secondary | ICD-10-CM | POA: Diagnosis not present

## 2023-09-29 DIAGNOSIS — R7309 Other abnormal glucose: Secondary | ICD-10-CM | POA: Diagnosis not present

## 2023-10-30 DIAGNOSIS — R7309 Other abnormal glucose: Secondary | ICD-10-CM | POA: Diagnosis not present

## 2023-11-29 DIAGNOSIS — R7309 Other abnormal glucose: Secondary | ICD-10-CM | POA: Diagnosis not present

## 2023-12-12 ENCOUNTER — Other Ambulatory Visit: Payer: Self-pay | Admitting: Family Medicine

## 2023-12-12 DIAGNOSIS — Z125 Encounter for screening for malignant neoplasm of prostate: Secondary | ICD-10-CM

## 2023-12-12 DIAGNOSIS — Z131 Encounter for screening for diabetes mellitus: Secondary | ICD-10-CM

## 2023-12-12 DIAGNOSIS — Z13220 Encounter for screening for lipoid disorders: Secondary | ICD-10-CM

## 2023-12-12 DIAGNOSIS — E291 Testicular hypofunction: Secondary | ICD-10-CM

## 2023-12-17 ENCOUNTER — Other Ambulatory Visit (INDEPENDENT_AMBULATORY_CARE_PROVIDER_SITE_OTHER): Payer: BC Managed Care – PPO

## 2023-12-17 DIAGNOSIS — Z125 Encounter for screening for malignant neoplasm of prostate: Secondary | ICD-10-CM | POA: Diagnosis not present

## 2023-12-17 DIAGNOSIS — Z136 Encounter for screening for cardiovascular disorders: Secondary | ICD-10-CM | POA: Diagnosis not present

## 2023-12-17 DIAGNOSIS — Z13220 Encounter for screening for lipoid disorders: Secondary | ICD-10-CM

## 2023-12-17 DIAGNOSIS — Z131 Encounter for screening for diabetes mellitus: Secondary | ICD-10-CM

## 2023-12-17 DIAGNOSIS — E291 Testicular hypofunction: Secondary | ICD-10-CM | POA: Diagnosis not present

## 2023-12-17 LAB — COMPREHENSIVE METABOLIC PANEL WITH GFR
ALT: 21 U/L (ref 0–53)
AST: 18 U/L (ref 0–37)
Albumin: 4.4 g/dL (ref 3.5–5.2)
Alkaline Phosphatase: 47 U/L (ref 39–117)
BUN: 19 mg/dL (ref 6–23)
CO2: 29 meq/L (ref 19–32)
Calcium: 9.3 mg/dL (ref 8.4–10.5)
Chloride: 103 meq/L (ref 96–112)
Creatinine, Ser: 1.07 mg/dL (ref 0.40–1.50)
GFR: 78.76 mL/min (ref >60.00)
Glucose, Bld: 94 mg/dL (ref 70–99)
Potassium: 4.7 meq/L (ref 3.5–5.1)
Sodium: 141 meq/L (ref 135–145)
Total Bilirubin: 0.5 mg/dL (ref 0.2–1.2)
Total Protein: 7.1 g/dL (ref 6.0–8.3)

## 2023-12-17 LAB — LIPID PANEL
Cholesterol: 196 mg/dL (ref 0–200)
HDL: 53.1 mg/dL (ref >39.00)
LDL Cholesterol: 108 mg/dL — ABNORMAL HIGH (ref 0–99)
NonHDL: 142.7
Total CHOL/HDL Ratio: 4
Triglycerides: 176 mg/dL — ABNORMAL HIGH (ref 0.0–149.0)
VLDL: 35.2 mg/dL (ref 0.0–40.0)

## 2023-12-18 ENCOUNTER — Ambulatory Visit: Payer: Self-pay | Admitting: Family Medicine

## 2023-12-18 LAB — PSA: PSA: 0.69 ng/mL (ref 0.10–4.00)

## 2023-12-21 ENCOUNTER — Encounter: Payer: Self-pay | Admitting: Family Medicine

## 2023-12-21 ENCOUNTER — Ambulatory Visit (INDEPENDENT_AMBULATORY_CARE_PROVIDER_SITE_OTHER): Payer: BC Managed Care – PPO | Admitting: Family Medicine

## 2023-12-21 VITALS — BP 132/86 | HR 91 | Temp 98.4°F | Ht 71.5 in | Wt 260.0 lb

## 2023-12-21 DIAGNOSIS — Z0000 Encounter for general adult medical examination without abnormal findings: Secondary | ICD-10-CM | POA: Diagnosis not present

## 2023-12-21 DIAGNOSIS — I10 Essential (primary) hypertension: Secondary | ICD-10-CM | POA: Diagnosis not present

## 2023-12-21 DIAGNOSIS — E785 Hyperlipidemia, unspecified: Secondary | ICD-10-CM | POA: Diagnosis not present

## 2023-12-21 DIAGNOSIS — D352 Benign neoplasm of pituitary gland: Secondary | ICD-10-CM

## 2023-12-21 DIAGNOSIS — E291 Testicular hypofunction: Secondary | ICD-10-CM

## 2023-12-21 LAB — MICROALBUMIN / CREATININE URINE RATIO
Creatinine,U: 201.7 mg/dL
Microalb Creat Ratio: 4.2 mg/g (ref 0.0–30.0)
Microalb, Ur: 0.8 mg/dL (ref 0.0–1.9)

## 2023-12-21 MED ORDER — AMLODIPINE BESYLATE 5 MG PO TABS: 5.0000 mg | ORAL_TABLET | Freq: Every day | ORAL | 4 refills | Status: AC

## 2023-12-21 NOTE — Patient Instructions (Addendum)
 Let me know if interested in cardiac CT with calcium score.  Continue amlodipine . Good to see you today Return as needed or in 1 year for next physical

## 2023-12-21 NOTE — Assessment & Plan Note (Signed)
 Preventative protocols reviewed and updated unless pt declined. Discussed healthy diet and lifestyle.

## 2023-12-21 NOTE — Assessment & Plan Note (Signed)
 Appreciate endo care - now on clomid  Q3d

## 2023-12-21 NOTE — Assessment & Plan Note (Addendum)
 Encouraged healthy diet and lifestyle choices to affect sustainable weight loss.  ?

## 2023-12-21 NOTE — Assessment & Plan Note (Addendum)
 Chronic, stable on current regimen - continue.  Update EKG today

## 2023-12-21 NOTE — Progress Notes (Signed)
 Ph: [phone] Fax: [phone]   Patient ID: Roger Evans, male    DOB: Feb 21, 1971, 53 y.o.   MRN: [id number]  This visit was conducted in person.  BP 132/86   Pulse 91   Temp 98.4 F (36.9 C) (Oral)   Ht 5' 11.5" (1.816 m)   Wt 260 lb (117.9 kg)   SpO2 95%   BMI 35.76 kg/m    CC: CPE Subjective:   HPI: Roger Evans is a 53 y.o. male presenting on 12/21/2023 for Annual Exam   Secondary hypogonadism presenting with R gynecomastia. MRI 05/2019 showed pituitary microadenoma. Sees [facility] endo Dr Vick Gram last seen 07/2023.    Some R ankle/foot pain for the past 4-6 wks.   Occ substernal chest fullness after dinner. More related to acidic foods.   Preventative: COLONOSCOPY 01/2023 - Mod diverticulosis, int hem rpt 10 yrs [place] [facility])  Prostate cancer screening - no fmhx, normal stream, no significant nocturia. Discussed options.  Lung cancer screening - not eligible  Flu shot - yearly COVID shot - Moderna 09/2019, 10/2019, Pfizer 05/2022 Td 2010, Tdap 2020 Pneumonia shot - declines for now Shingrix - discussed. Has not had chicken pox.  Advanced directive discussion -  Seat belt use discussed Sunscreen use discussed. No changing moles on skin. Sees derm.  Sleep - averaging 6-7 hours/night Non smoker Alcohol - 1-2 beers/week Dentist - q6 mo Eye exam - yearly  4-5 cups coffee in am Married, 2 children  Occupation: IT at [employer] Activity: no regular exercise, some yard work  Diet: good water, fruits/vegetables daily, limited red meat, good poultry and fish      Relevant past medical, surgical, family and social history reviewed and updated as indicated. Interim medical history since our last visit reviewed. Allergies and medications reviewed and updated. Outpatient Medications Prior to Visit  Medication Sig Dispense Refill   CLOMID  50 MG tablet Take 1 tablet (50 mg total) by mouth every 3 (three) days.     famotidine  (PEPCID ) 20 MG tablet Take 1 tablet (20  mg total) by mouth daily as needed for heartburn or indigestion.     fluticasone  (FLONASE ) 50 MCG/ACT nasal spray Place 2 sprays into both nostrils daily. (Patient taking differently: Place 2 sprays into both nostrils daily as needed.) 16 g 1   amLODipine  (NORVASC ) 5 MG tablet Take 1 tablet (5 mg total) by mouth daily. 90 tablet 2   No facility-administered medications prior to visit.     Per HPI unless specifically indicated in ROS section below Review of Systems  Constitutional:  Negative for activity change, appetite change, chills, fatigue, fever and unexpected weight change.  HENT:  Negative for hearing loss.   Eyes:  Negative for visual disturbance.  Respiratory:  Positive for chest tightness (occ - after dinner). Negative for cough, shortness of breath and wheezing.   Cardiovascular:  Negative for chest pain, palpitations and leg swelling.  Gastrointestinal:  Negative for abdominal distention, abdominal pain, blood in stool, constipation, diarrhea, nausea and vomiting.  Genitourinary:  Negative for difficulty urinating and hematuria.  Musculoskeletal:  Negative for arthralgias, myalgias and neck pain.  Skin:  Negative for rash.  Neurological:  Positive for headaches (allergy related). Negative for dizziness, seizures and syncope.  Hematological:  Negative for adenopathy. Does not bruise/bleed easily.  Psychiatric/Behavioral:  Negative for dysphoric mood. The patient is not nervous/anxious.     Objective:  BP 132/86   Pulse 91   Temp 98.4 F (36.9 C) (Oral)  Ht 5' 11.5" (1.816 m)   Wt 260 lb (117.9 kg)   SpO2 95%   BMI 35.76 kg/m   Wt Readings from Last 3 Encounters:  12/21/23 260 lb (117.9 kg)  08/10/23 260 lb 2 oz (118 kg)  07/27/23 263 lb 8 oz (119.5 kg)      Physical Exam Vitals and nursing note reviewed.  Constitutional:      General: He is not in acute distress.    Appearance: Normal appearance. He is well-developed. He is not ill-appearing.  HENT:     Head:  Normocephalic and atraumatic.     Right Ear: Hearing, tympanic membrane, ear canal and external ear normal.     Left Ear: Hearing, tympanic membrane, ear canal and external ear normal.     Mouth/Throat:     Mouth: Mucous membranes are moist.     Pharynx: Oropharynx is clear. No oropharyngeal exudate or posterior oropharyngeal erythema.  Eyes:     General: No scleral icterus.    Extraocular Movements: Extraocular movements intact.     Conjunctiva/sclera: Conjunctivae normal.     Pupils: Pupils are equal, round, and reactive to light.  Neck:     Thyroid : No thyroid  mass or thyromegaly.     Vascular: No carotid bruit.  Cardiovascular:     Rate and Rhythm: Normal rate and regular rhythm.     Pulses: Normal pulses.          Radial pulses are 2+ on the right side and 2+ on the left side.     Heart sounds: Normal heart sounds. No murmur heard. Pulmonary:     Effort: Pulmonary effort is normal. No respiratory distress.     Breath sounds: Normal breath sounds. No wheezing, rhonchi or rales.  Abdominal:     General: Bowel sounds are normal. There is no distension.     Palpations: Abdomen is soft. There is no mass.     Tenderness: There is no abdominal tenderness. There is no guarding or rebound.     Hernia: No hernia is present.  Musculoskeletal:        General: Normal range of motion.     Cervical back: Normal range of motion and neck supple.     Right lower leg: No edema.     Left lower leg: No edema.  Lymphadenopathy:     Cervical: No cervical adenopathy.  Skin:    General: Skin is warm and dry.     Findings: No rash.  Neurological:     General: No focal deficit present.     Mental Status: He is alert and oriented to person, place, and time.  Psychiatric:        Mood and Affect: Mood normal.        Behavior: Behavior normal.        Thought Content: Thought content normal.        Judgment: Judgment normal.       Results for orders placed or performed in visit on 12/17/23   Comprehensive metabolic panel with GFR   Collection Time: 12/17/23  8:15 AM  Result Value Ref Range   Sodium 141 135 - 145 mEq/L   Potassium 4.7 3.5 - 5.1 mEq/L   Chloride 103 96 - 112 mEq/L   CO2 29 19 - 32 mEq/L   Glucose, Bld 94 70 - 99 mg/dL   BUN 19 6 - 23 mg/dL   Creatinine, Ser 4.09 0.40 - 1.50 mg/dL   Total Bilirubin 0.5 0.2 -  1.2 mg/dL   Alkaline Phosphatase 47 39 - 117 U/L   AST 18 0 - 37 U/L   ALT 21 0 - 53 U/L   Total Protein 7.1 6.0 - 8.3 g/dL   Albumin 4.4 3.5 - 5.2 g/dL   GFR 69.62 >95.28 mL/min   Calcium 9.3 8.4 - 10.5 mg/dL  PSA   Collection Time: 12/17/23  8:15 AM  Result Value Ref Range   PSA 0.69 0.10 - 4.00 ng/mL  Lipid panel   Collection Time: 12/17/23  8:15 AM  Result Value Ref Range   Cholesterol 196 0 - 200 mg/dL   Triglycerides 413.2 (H) 0.0 - 149.0 mg/dL   HDL 44.01 >02.72 mg/dL   VLDL 53.6 0.0 - 64.4 mg/dL   LDL Cholesterol 034 (H) 0 - 99 mg/dL   Total CHOL/HDL Ratio 4    NonHDL 142.70    EKG - NSR rate 75, normal axis, intervals, no hypertrophy or acute ST/T changes  Assessment & Plan:   Problem List Items Addressed This Visit     Health maintenance examination - Primary (Chronic)   Preventative protocols reviewed and updated unless pt declined. Discussed healthy diet and lifestyle.       Severe obesity (BMI 35.0-39.9) with comorbidity (HCC)   Encouraged healthy diet and lifestyle choices to affect sustainable weight loss.       Secondary male hypogonadism   Appreciate endo care - now on clomid  Q3d      Pituitary microadenoma (HCC)   Followed by [facility] Dr Vick Gram.       White coat syndrome with hypertension   Chronic, stable on current regimen - continue.  Update EKG today      Relevant Medications   amLODipine  (NORVASC ) 5 MG tablet   Other Relevant Orders   Microalbumin / creatinine urine ratio   EKG 12-Lead (Completed)   Dyslipidemia   Mild, anticipate diet related - recent trip to beach.  Discussed cardiac CT with  calcium score - will defer for now.  The 10-year ASCVD risk score (Arnett DK, et al., 2019) is: 5.8%   Values used to calculate the score:     Age: 59 years     Sex: Male     Is Non-Hispanic African American: No     Diabetic: No     Tobacco smoker: No     Systolic Blood Pressure: 132 mmHg     Is BP treated: Yes     HDL Cholesterol: 53.1 mg/dL     Total Cholesterol: 196 mg/dL         Meds ordered this encounter  Medications   amLODipine  (NORVASC ) 5 MG tablet    Sig: Take 1 tablet (5 mg total) by mouth daily.    Dispense:  90 tablet    Refill:  4    Orders Placed This Encounter  Procedures   Microalbumin / creatinine urine ratio   EKG 12-Lead    Patient Instructions  Let me know if interested in cardiac CT with calcium score.  Continue amlodipine . Good to see you today Return as needed or in 1 year for next physical   Follow up plan: Return in about 1 year (around 12/20/2024) for annual exam, prior fasting for blood work.  Claire Crick, MD

## 2023-12-21 NOTE — Assessment & Plan Note (Addendum)
 Mild, anticipate diet related - recent trip to beach.  Discussed cardiac CT with calcium score - will defer for now.  The 10-year ASCVD risk score (Arnett DK, et al., 2019) is: 5.8%   Values used to calculate the score:     Age: 53 years     Sex: Male     Is Non-Hispanic African American: No     Diabetic: No     Tobacco smoker: No     Systolic Blood Pressure: 132 mmHg     Is BP treated: Yes     HDL Cholesterol: 53.1 mg/dL     Total Cholesterol: 196 mg/dL

## 2023-12-21 NOTE — Assessment & Plan Note (Addendum)
 Followed by [facility] Dr Vick Gram.

## 2023-12-22 ENCOUNTER — Ambulatory Visit: Payer: Self-pay | Admitting: Family Medicine

## 2023-12-25 ENCOUNTER — Encounter: Payer: BC Managed Care – PPO | Admitting: Family Medicine

## 2023-12-30 DIAGNOSIS — R7309 Other abnormal glucose: Secondary | ICD-10-CM | POA: Diagnosis not present

## 2024-01-16 DIAGNOSIS — E291 Testicular hypofunction: Secondary | ICD-10-CM | POA: Diagnosis not present

## 2024-01-16 DIAGNOSIS — N62 Hypertrophy of breast: Secondary | ICD-10-CM | POA: Diagnosis not present

## 2024-01-16 DIAGNOSIS — D352 Benign neoplasm of pituitary gland: Secondary | ICD-10-CM | POA: Diagnosis not present

## 2024-01-29 DIAGNOSIS — R7309 Other abnormal glucose: Secondary | ICD-10-CM | POA: Diagnosis not present

## 2024-02-29 DIAGNOSIS — R7309 Other abnormal glucose: Secondary | ICD-10-CM | POA: Diagnosis not present

## 2024-03-31 DIAGNOSIS — R7309 Other abnormal glucose: Secondary | ICD-10-CM | POA: Diagnosis not present

## 2024-04-30 DIAGNOSIS — R7309 Other abnormal glucose: Secondary | ICD-10-CM | POA: Diagnosis not present

## 2024-05-05 DIAGNOSIS — D2262 Melanocytic nevi of left upper limb, including shoulder: Secondary | ICD-10-CM | POA: Diagnosis not present

## 2024-05-05 DIAGNOSIS — D225 Melanocytic nevi of trunk: Secondary | ICD-10-CM | POA: Diagnosis not present

## 2024-05-05 DIAGNOSIS — D2272 Melanocytic nevi of left lower limb, including hip: Secondary | ICD-10-CM | POA: Diagnosis not present

## 2024-05-05 DIAGNOSIS — D2261 Melanocytic nevi of right upper limb, including shoulder: Secondary | ICD-10-CM | POA: Diagnosis not present

## 2024-05-05 DIAGNOSIS — L570 Actinic keratosis: Secondary | ICD-10-CM | POA: Diagnosis not present

## 2024-05-31 DIAGNOSIS — R7309 Other abnormal glucose: Secondary | ICD-10-CM | POA: Diagnosis not present

## 2024-07-11 DIAGNOSIS — E291 Testicular hypofunction: Secondary | ICD-10-CM | POA: Diagnosis not present

## 2024-07-11 DIAGNOSIS — R030 Elevated blood-pressure reading, without diagnosis of hypertension: Secondary | ICD-10-CM | POA: Diagnosis not present

## 2024-07-11 DIAGNOSIS — D352 Benign neoplasm of pituitary gland: Secondary | ICD-10-CM | POA: Diagnosis not present

## 2024-07-11 DIAGNOSIS — N62 Hypertrophy of breast: Secondary | ICD-10-CM | POA: Diagnosis not present

## 2024-09-05 ENCOUNTER — Ambulatory Visit: Payer: Self-pay | Admitting: Adult Health

## 2024-09-05 ENCOUNTER — Other Ambulatory Visit: Payer: Self-pay

## 2024-09-05 ENCOUNTER — Encounter: Payer: Self-pay | Admitting: Adult Health

## 2024-09-05 VITALS — BP 124/84 | HR 88 | Temp 96.7°F | Ht 71.5 in | Wt 255.0 lb

## 2024-09-05 DIAGNOSIS — Z0184 Encounter for antibody response examination: Secondary | ICD-10-CM

## 2024-09-05 NOTE — Progress Notes (Signed)
 Therapist, [facility] [address] [place], [place] [address]   Office Visit Note  Patient Name: Roger Evans Date of Birth [id number]  Medical Record number [id number]  Date of Service: 09/05/2024  Chief Complaint  Patient presents with   Acute Visit    Patient would like to discuss whether or not he needs the MMR vaccine booster. He did receive ithe vaccine as a child.     HPI Pt is here requesting information on MMR vaccine.  He cares for elderly parents and in-laws and wants to know if he needs to get a Measles booster due to the current measles outbreak. Denies any exposures.    Current Medication:  Outpatient Encounter Medications as of 09/05/2024  Medication Sig   amLODipine  (NORVASC ) 5 MG tablet Take 1 tablet (5 mg total) by mouth daily.   CLOMID  50 MG tablet Take 1 tablet (50 mg total) by mouth every 3 (three) days. (Patient taking differently: Take 50 mg by mouth. Every 5 days)   famotidine  (PEPCID ) 20 MG tablet Take 1 tablet (20 mg total) by mouth daily as needed for heartburn or indigestion.   fluticasone  (FLONASE ) 50 MCG/ACT nasal spray Place 2 sprays into both nostrils daily. (Patient taking differently: Place 2 sprays into both nostrils daily as needed.)   No facility-administered encounter medications on file as of 09/05/2024.      Medical History: Past Medical History:  Diagnosis Date   Allergy    Seasonal   GERD (gastroesophageal reflux disease)    Occasional   Hypertension    Radiculitis of left cervical region 07/31/2012   saw PT, [facility] - L arm weakness consistent with C6-7 cervical radiculitis confirmed by MRI and EMG (disc pressing on L C7 nerve root) offered neurosurgeon vs PM&R referral     Vital Signs: BP 124/84   Pulse 88   Temp (!) 96.7 F (35.9 C)   Ht 5' 11.5 (1.816 m)   Wt 255 lb (115.7 kg)   SpO2 98%   BMI 35.07 kg/m    Review of Systems  Constitutional:  Negative for chills, fatigue and fever.    Physical Exam Vitals  reviewed.  Constitutional:      Appearance: Normal appearance.  Neurological:     Mental Status: He is alert.     Assessment/Plan: 1. Immunity status testing (Primary) Titer drawn, will consider MMR booster.  - Measles/Mumps/Rubella Immunity     General Counseling: [facility] understanding of the findings of todays visit and agrees with plan of treatment. I have discussed any further diagnostic evaluation that may be needed or ordered today. We also reviewed his medications today. he has been encouraged to call the office with any questions or concerns that should arise related to todays visit.   Orders Placed This Encounter  Procedures   Measles/Mumps/Rubella Immunity    No orders of the defined types were placed in this encounter.   Time spent:15 Minutes    Juliene DOROTHA Howells AGNP-C Nurse Practitioner

## 2024-12-17 ENCOUNTER — Other Ambulatory Visit

## 2024-12-24 ENCOUNTER — Encounter: Admitting: Family Medicine
